# Patient Record
Sex: Male | Born: 1945 | Race: White | Hispanic: No | Marital: Married | Smoking: Former smoker
Health system: Southern US, Community
[De-identification: ages and names within clinical notes are randomized; demographics above are authoritative.]

## PROBLEM LIST (undated history)

## (undated) DIAGNOSIS — M069 Rheumatoid arthritis, unspecified: Secondary | ICD-10-CM

## (undated) DIAGNOSIS — R945 Abnormal results of liver function studies: Secondary | ICD-10-CM

## (undated) DIAGNOSIS — C4490 Unspecified malignant neoplasm of skin, unspecified: Secondary | ICD-10-CM

## (undated) DIAGNOSIS — M8580 Other specified disorders of bone density and structure, unspecified site: Secondary | ICD-10-CM

## (undated) DIAGNOSIS — K219 Gastro-esophageal reflux disease without esophagitis: Secondary | ICD-10-CM

## (undated) DIAGNOSIS — R7989 Other specified abnormal findings of blood chemistry: Secondary | ICD-10-CM

## (undated) DIAGNOSIS — C4491 Basal cell carcinoma of skin, unspecified: Secondary | ICD-10-CM

## (undated) DIAGNOSIS — I10 Essential (primary) hypertension: Secondary | ICD-10-CM

## (undated) DIAGNOSIS — I2510 Atherosclerotic heart disease of native coronary artery without angina pectoris: Secondary | ICD-10-CM

## (undated) HISTORY — PX: CARDIAC CATHETERIZATION: SHX172

## (undated) HISTORY — DX: Unspecified malignant neoplasm of skin, unspecified: C44.90

## (undated) HISTORY — DX: Basal cell carcinoma of skin, unspecified: C44.91

## (undated) HISTORY — DX: Abnormal results of liver function studies: R94.5

## (undated) HISTORY — DX: Other specified abnormal findings of blood chemistry: R79.89

## (undated) HISTORY — DX: Atherosclerotic heart disease of native coronary artery without angina pectoris: I25.10

## (undated) HISTORY — DX: Other specified disorders of bone density and structure, unspecified site: M85.80

## (undated) HISTORY — PX: APPENDECTOMY: SHX54

## (undated) HISTORY — DX: Rheumatoid arthritis, unspecified: M06.9

## (undated) HISTORY — PX: COSMETIC SURGERY: SHX468

---

## 2004-12-01 ENCOUNTER — Encounter: Payer: Self-pay | Admitting: Family Medicine

## 2004-12-01 ENCOUNTER — Ambulatory Visit: Payer: Self-pay | Admitting: Family Medicine

## 2004-12-01 LAB — CONVERTED CEMR LAB: PSA: NORMAL ng/mL

## 2004-12-03 ENCOUNTER — Ambulatory Visit: Payer: Self-pay | Admitting: Family Medicine

## 2004-12-16 ENCOUNTER — Encounter: Admission: RE | Admit: 2004-12-16 | Discharge: 2005-02-01 | Payer: Self-pay | Admitting: Family Medicine

## 2004-12-23 ENCOUNTER — Ambulatory Visit: Payer: Self-pay | Admitting: Family Medicine

## 2004-12-23 LAB — CONVERTED CEMR LAB: Microalbumin U total vol: NORMAL mg/L

## 2005-08-04 ENCOUNTER — Ambulatory Visit: Payer: Self-pay | Admitting: Family Medicine

## 2005-08-14 ENCOUNTER — Ambulatory Visit: Payer: Self-pay | Admitting: Family Medicine

## 2005-09-01 ENCOUNTER — Ambulatory Visit: Payer: Self-pay | Admitting: Family Medicine

## 2005-09-01 LAB — CONVERTED CEMR LAB: Hgb A1c MFr Bld: 6.3 %

## 2005-09-16 ENCOUNTER — Ambulatory Visit: Payer: Self-pay | Admitting: Family Medicine

## 2005-10-14 ENCOUNTER — Ambulatory Visit: Payer: Self-pay | Admitting: Family Medicine

## 2005-10-30 ENCOUNTER — Ambulatory Visit: Payer: Self-pay | Admitting: Family Medicine

## 2005-11-10 DIAGNOSIS — M069 Rheumatoid arthritis, unspecified: Secondary | ICD-10-CM | POA: Insufficient documentation

## 2005-11-10 DIAGNOSIS — M949 Disorder of cartilage, unspecified: Secondary | ICD-10-CM

## 2005-11-10 DIAGNOSIS — K219 Gastro-esophageal reflux disease without esophagitis: Secondary | ICD-10-CM | POA: Insufficient documentation

## 2005-11-10 DIAGNOSIS — J309 Allergic rhinitis, unspecified: Secondary | ICD-10-CM | POA: Insufficient documentation

## 2005-11-10 DIAGNOSIS — N200 Calculus of kidney: Secondary | ICD-10-CM

## 2005-11-10 DIAGNOSIS — M899 Disorder of bone, unspecified: Secondary | ICD-10-CM | POA: Insufficient documentation

## 2005-11-10 DIAGNOSIS — N400 Enlarged prostate without lower urinary tract symptoms: Secondary | ICD-10-CM | POA: Insufficient documentation

## 2005-11-10 DIAGNOSIS — E119 Type 2 diabetes mellitus without complications: Secondary | ICD-10-CM

## 2005-11-10 DIAGNOSIS — E669 Obesity, unspecified: Secondary | ICD-10-CM

## 2005-11-10 DIAGNOSIS — K5730 Diverticulosis of large intestine without perforation or abscess without bleeding: Secondary | ICD-10-CM | POA: Insufficient documentation

## 2005-11-10 DIAGNOSIS — E785 Hyperlipidemia, unspecified: Secondary | ICD-10-CM | POA: Insufficient documentation

## 2005-11-10 DIAGNOSIS — I10 Essential (primary) hypertension: Secondary | ICD-10-CM

## 2005-11-10 DIAGNOSIS — M2550 Pain in unspecified joint: Secondary | ICD-10-CM | POA: Insufficient documentation

## 2006-05-27 ENCOUNTER — Encounter: Payer: Self-pay | Admitting: Family Medicine

## 2006-06-17 ENCOUNTER — Ambulatory Visit: Payer: Self-pay | Admitting: Family Medicine

## 2006-06-17 DIAGNOSIS — L570 Actinic keratosis: Secondary | ICD-10-CM | POA: Insufficient documentation

## 2006-06-17 DIAGNOSIS — I2510 Atherosclerotic heart disease of native coronary artery without angina pectoris: Secondary | ICD-10-CM | POA: Insufficient documentation

## 2006-08-16 ENCOUNTER — Ambulatory Visit: Payer: Self-pay | Admitting: Family Medicine

## 2006-08-16 DIAGNOSIS — D692 Other nonthrombocytopenic purpura: Secondary | ICD-10-CM | POA: Insufficient documentation

## 2006-08-24 ENCOUNTER — Encounter: Payer: Self-pay | Admitting: Family Medicine

## 2006-09-21 ENCOUNTER — Ambulatory Visit: Payer: Self-pay | Admitting: Family Medicine

## 2006-09-21 LAB — CONVERTED CEMR LAB
BUN: 14 mg/dL (ref 6–23)
CO2: 24 meq/L (ref 19–32)
Glucose, Bld: 98 mg/dL (ref 70–99)
LDL Cholesterol: 97 mg/dL (ref 0–99)
Potassium: 4.1 meq/L (ref 3.5–5.3)
Sodium: 137 meq/L (ref 135–145)
Total CHOL/HDL Ratio: 3.6
VLDL: 19 mg/dL (ref 0–40)

## 2006-09-22 ENCOUNTER — Encounter: Payer: Self-pay | Admitting: Family Medicine

## 2006-10-07 ENCOUNTER — Ambulatory Visit: Payer: Self-pay | Admitting: Family Medicine

## 2007-04-18 ENCOUNTER — Encounter: Payer: Self-pay | Admitting: Family Medicine

## 2007-06-07 ENCOUNTER — Encounter: Payer: Self-pay | Admitting: Family Medicine

## 2007-06-07 LAB — CONVERTED CEMR LAB
Albumin: 3.7 g/dL
Alkaline Phosphatase: 34 units/L
BUN: 16 mg/dL
Creatinine, Ser: 0.66 mg/dL
Glucose, Bld: 111 mg/dL
HDL: 40 mg/dL
Hgb A1c MFr Bld: 6.6 %
LDL Cholesterol: 129 mg/dL
Total Bilirubin: 1 mg/dL
Triglycerides: 136 mg/dL
WBC, blood: 5.4 10*3/uL

## 2007-07-25 ENCOUNTER — Telehealth: Payer: Self-pay | Admitting: Family Medicine

## 2007-07-27 ENCOUNTER — Encounter: Payer: Self-pay | Admitting: Family Medicine

## 2007-08-01 ENCOUNTER — Encounter: Payer: Self-pay | Admitting: Family Medicine

## 2007-08-16 ENCOUNTER — Encounter: Payer: Self-pay | Admitting: Family Medicine

## 2007-08-18 ENCOUNTER — Ambulatory Visit: Payer: Self-pay | Admitting: Family Medicine

## 2007-08-18 LAB — CONVERTED CEMR LAB: Hgb A1c MFr Bld: 6.8 %

## 2007-08-22 ENCOUNTER — Encounter: Payer: Self-pay | Admitting: Family Medicine

## 2007-09-26 ENCOUNTER — Encounter: Payer: Self-pay | Admitting: Family Medicine

## 2008-04-18 ENCOUNTER — Encounter: Payer: Self-pay | Admitting: Family Medicine

## 2010-10-07 ENCOUNTER — Encounter: Payer: Self-pay | Admitting: Emergency Medicine

## 2010-10-07 ENCOUNTER — Other Ambulatory Visit: Payer: Self-pay | Admitting: Emergency Medicine

## 2010-10-07 ENCOUNTER — Inpatient Hospital Stay (INDEPENDENT_AMBULATORY_CARE_PROVIDER_SITE_OTHER)
Admission: RE | Admit: 2010-10-07 | Discharge: 2010-10-07 | Disposition: A | Discharge status: Home / Self Care | Payer: BC Managed Care – PPO | Source: Ambulatory Visit | Attending: Emergency Medicine | Admitting: Emergency Medicine

## 2010-10-07 ENCOUNTER — Telehealth (INDEPENDENT_AMBULATORY_CARE_PROVIDER_SITE_OTHER): Payer: Self-pay | Admitting: *Deleted

## 2010-10-07 ENCOUNTER — Ambulatory Visit
Admission: RE | Admit: 2010-10-07 | Discharge: 2010-10-07 | Disposition: A | Discharge status: Home / Self Care | Payer: BC Managed Care – PPO | Source: Ambulatory Visit | Attending: Emergency Medicine | Admitting: Emergency Medicine

## 2010-10-07 DIAGNOSIS — M1711 Unilateral primary osteoarthritis, right knee: Secondary | ICD-10-CM | POA: Insufficient documentation

## 2010-10-07 DIAGNOSIS — M25569 Pain in unspecified knee: Secondary | ICD-10-CM

## 2011-01-05 NOTE — Telephone Encounter (Signed)
  Phone Note Outgoing Call   Call placed by: Clemens Catholic LPN,  October 07, 2010 2:36 PM Summary of Call: appt sch'ed with dr Charlann Boxer for 10/17/10 @ 8:15am, pt to pick up copy of xrays from [facility], notes faxed, and pts wife notified of the appt.  Initial call taken by: Clemens Catholic LPN,  October 07, 2010 2:37 PM

## 2011-01-05 NOTE — Progress Notes (Signed)
Summary: Right Knee Pain rm 2   Vital Signs:  Patient Profile:   66 Years Old Male CC:      RT knee pain x 1wk Height:     68.5 inches (173.99 cm) Weight:      249.50 pounds O2 Sat:      97 % O2 treatment:    Room Air Temp:     97.7 degrees F oral Pulse rate:   87 / minute Resp:     20 per minute BP sitting:   120 / 74  (left arm) Cuff size:   large  Pt. in pain?   yes    Location:   knee    Intensity:   8    Type:       sharp  Vitals Entered By: Clemens Catholic LPN (October 07, 2010 9:33 AM)                   Updated Prior Medication List: ALTACE 10 MG CAPS (RAMIPRIL) Take 1 tablet by mouth once a day FOLIC ACID 1 MG TABS (FOLIC ACID) Take 1 tablet by mouth once a day METFORMIN HCL 500 MG TABS (METFORMIN HCL) one by mouth two times a day METHYLPREDNISOLONE 4 MG TABS (METHYLPREDNISOLONE) Take 1 tablet by mouth once a day OMEPRAZOLE 20 MG CPDR (OMEPRAZOLE) Take 1 tablet by mouth daily ACCU-CHEK SOFTCLIX LANCETS  MISC (LANCETS) use as directed NITROGLYCERIN 0.4 MG  SUBL (NITROGLYCERIN) TAke as directed PLAVIX 75 MG TABS (CLOPIDOGREL BISULFATE) Take one tablet by mouth once a day MIRTAZAPINE 30 MG  TBDP (MIRTAZAPINE) Take 1 tablet by mouth once a day at bedtime for insomnia/depression GLIMEPIRIDE 4 MG TABS (GLIMEPIRIDE)  HUMIRA 40 MG/0.8ML KIT (ADALIMUMAB)  NITROSTAT 0.3 MG SUBL (NITROGLYCERIN)   Current Allergies (reviewed today): ! LIPITOR ! * FLU SHOTS ! * FLORESCEINE STAINHistory of Present Illness Chief Complaint: RT knee pain x 1wk History of Present Illness: R knee pain for a week. Has RA and OA and has always had some knee pain.  He also injured it just prior to retirement.  He was golfing last week and felt pain after hitting the ball on an upslope.  Doesn't know if he acutely twisted it.  Using ice and tramadol which helps.  No popping, locking, or giving way.  REVIEW OF SYSTEMS Constitutional Symptoms      Denies fever, chills, night sweats, weight loss,  weight gain, and fatigue.  Eyes       Denies change in vision, eye pain, eye discharge, glasses, contact lenses, and eye surgery. Ear/Nose/Throat/Mouth       Denies hearing loss/aids, change in hearing, ear pain, ear discharge, dizziness, frequent runny nose, frequent nose bleeds, sinus problems, sore throat, hoarseness, and tooth pain or bleeding.  Respiratory       Denies dry cough, productive cough, wheezing, shortness of breath, asthma, bronchitis, and emphysema/COPD.  Cardiovascular       Denies murmurs, chest pain, and tires easily with exhertion.    Gastrointestinal       Denies stomach pain, nausea/vomiting, diarrhea, constipation, blood in bowel movements, and indigestion. Genitourniary       Denies painful urination, kidney stones, and loss of urinary control. Neurological       Denies paralysis, seizures, and fainting/blackouts. Musculoskeletal       Complains of muscle pain, joint pain, and joint stiffness.      Denies decreased range of motion, redness, swelling, muscle weakness, and gout.  Skin  Denies bruising, unusual mles/lumps or sores, and hair/skin or nail changes.  Psych       Denies mood changes, temper/anger issues, anxiety/stress, speech problems, depression, and sleep problems. Other Comments: pt c/o RT knee pain and swelling x 1wk. he has taken tramadol 50 mg with no relief.   Past History:  Past Medical History: Reviewed history from 06/17/2006 and no changes required. 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 Appendectomy  fibroma removed  rhinoplasty cath with stenting 4-08  Family History: Reviewed history from 05/27/2006 and no changes required. brother- Chrons dz, brain  tumor, brother- heart dz age 70  father- heart dz, died @ 103, high chol, HTN, stroke  mother died at 101  Social History: Reviewed history from 11/10/2005 and no changes required. Retired.  Married to [place].  Spends winter in [place].  Quit smoking age 89.  2 grown children.  Does not eat healthywt, not exercising. Physical Exam General appearance: well developed, well nourished, mild distress, using a walker MSE: oriented to time, place, and person R knee: ROM limited by pain about 45 degrees flexion, 2+ suprapatellar effusion, no ecchymoses, +McMurrays, Patella freely mobile. +TTP medial joint line.  Distal NV status intact. Assessment New Problems: KNEE PAIN (ICD-719.46)   Plan New Medications/Changes: TRAMADOL HCL 50 MG TABS (TRAMADOL HCL) 1 by mouth q6-8 hrs as needed for pain  #30 x 0, 10/07/2010, Hoyt Koch MD  New Orders: New Patient Level III [phone] T-DG Knee Complete 4 Views*R* [73564] Planning Comments:   Xray ordered and read by radiology as "Tricompartment degenerative changes with joint effusion."  I expect that this is a medial meniscus tear secondary to OA.  Will order MRI and send to orthopedics for further evaluation.  Wife sees Dr. Charlann Boxer so that may be a good choice.  Tramadol Rx and encourage ice, rest, elevation.    The patient and/or caregiver has been counseled thoroughly with regard to medications prescribed including dosage, schedule, interactions, rationale for use, and possible side effects and they verbalize understanding.  Diagnoses and expected course of recovery discussed and will return if not improved as expected or if the condition worsens. Patient and/or caregiver verbalized understanding.  Prescriptions: TRAMADOL HCL 50 MG TABS (TRAMADOL HCL) 1 by mouth q6-8 hrs as needed for pain  #30 x 0   Entered and Authorized by:   Hoyt Koch MD   Signed by:   Hoyt Koch MD on 10/07/2010   Method used:   Print then Give to Patient   RxID:    [id number]   Orders Added: 1)  New Patient Level III [95621] 2)  T-DG Knee Complete 4 Views*R* [30865]

## 2011-10-28 ENCOUNTER — Emergency Department (HOSPITAL_BASED_OUTPATIENT_CLINIC_OR_DEPARTMENT_OTHER): Payer: BC Managed Care – PPO

## 2011-10-28 ENCOUNTER — Telehealth: Payer: Self-pay | Admitting: *Deleted

## 2011-10-28 ENCOUNTER — Other Ambulatory Visit: Payer: Self-pay | Admitting: Family Medicine

## 2011-10-28 ENCOUNTER — Ambulatory Visit (INDEPENDENT_AMBULATORY_CARE_PROVIDER_SITE_OTHER): Payer: BC Managed Care – PPO | Admitting: Family Medicine

## 2011-10-28 ENCOUNTER — Encounter (HOSPITAL_BASED_OUTPATIENT_CLINIC_OR_DEPARTMENT_OTHER): Payer: Self-pay

## 2011-10-28 ENCOUNTER — Encounter: Payer: Self-pay | Admitting: Family Medicine

## 2011-10-28 ENCOUNTER — Ambulatory Visit (HOSPITAL_BASED_OUTPATIENT_CLINIC_OR_DEPARTMENT_OTHER)
Admission: RE | Admit: 2011-10-28 | Discharge: 2011-10-28 | Disposition: A | Discharge status: Home / Self Care | Payer: BC Managed Care – PPO | Source: Ambulatory Visit | Attending: Physician Assistant | Admitting: Physician Assistant

## 2011-10-28 ENCOUNTER — Ambulatory Visit (INDEPENDENT_AMBULATORY_CARE_PROVIDER_SITE_OTHER): Payer: BC Managed Care – PPO

## 2011-10-28 ENCOUNTER — Other Ambulatory Visit: Payer: Self-pay

## 2011-10-28 ENCOUNTER — Inpatient Hospital Stay (HOSPITAL_BASED_OUTPATIENT_CLINIC_OR_DEPARTMENT_OTHER)
Admission: EM | Admit: 2011-10-28 | Discharge: 2011-11-01 | DRG: 541 | Disposition: A | Discharge status: Home / Self Care | Payer: BC Managed Care – PPO | Attending: Internal Medicine | Admitting: Internal Medicine

## 2011-10-28 VITALS — BP 151/92 | HR 69 | Temp 97.7°F | Wt 249.0 lb

## 2011-10-28 DIAGNOSIS — L570 Actinic keratosis: Secondary | ICD-10-CM

## 2011-10-28 DIAGNOSIS — I2692 Saddle embolus of pulmonary artery without acute cor pulmonale: Secondary | ICD-10-CM | POA: Diagnosis present

## 2011-10-28 DIAGNOSIS — E785 Hyperlipidemia, unspecified: Secondary | ICD-10-CM | POA: Diagnosis present

## 2011-10-28 DIAGNOSIS — M2550 Pain in unspecified joint: Secondary | ICD-10-CM

## 2011-10-28 DIAGNOSIS — Z6836 Body mass index (BMI) 36.0-36.9, adult: Secondary | ICD-10-CM

## 2011-10-28 DIAGNOSIS — J309 Allergic rhinitis, unspecified: Secondary | ICD-10-CM

## 2011-10-28 DIAGNOSIS — I82409 Acute embolism and thrombosis of unspecified deep veins of unspecified lower extremity: Secondary | ICD-10-CM

## 2011-10-28 DIAGNOSIS — R0602 Shortness of breath: Secondary | ICD-10-CM

## 2011-10-28 DIAGNOSIS — M899 Disorder of bone, unspecified: Secondary | ICD-10-CM | POA: Diagnosis present

## 2011-10-28 DIAGNOSIS — I2510 Atherosclerotic heart disease of native coronary artery without angina pectoris: Secondary | ICD-10-CM | POA: Diagnosis present

## 2011-10-28 DIAGNOSIS — I824Y9 Acute embolism and thrombosis of unspecified deep veins of unspecified proximal lower extremity: Secondary | ICD-10-CM | POA: Insufficient documentation

## 2011-10-28 DIAGNOSIS — E119 Type 2 diabetes mellitus without complications: Secondary | ICD-10-CM | POA: Diagnosis present

## 2011-10-28 DIAGNOSIS — N200 Calculus of kidney: Secondary | ICD-10-CM

## 2011-10-28 DIAGNOSIS — R05 Cough: Secondary | ICD-10-CM

## 2011-10-28 DIAGNOSIS — Z9861 Coronary angioplasty status: Secondary | ICD-10-CM

## 2011-10-28 DIAGNOSIS — M25569 Pain in unspecified knee: Secondary | ICD-10-CM

## 2011-10-28 DIAGNOSIS — K5730 Diverticulosis of large intestine without perforation or abscess without bleeding: Secondary | ICD-10-CM

## 2011-10-28 DIAGNOSIS — I82419 Acute embolism and thrombosis of unspecified femoral vein: Secondary | ICD-10-CM | POA: Diagnosis present

## 2011-10-28 DIAGNOSIS — R600 Localized edema: Secondary | ICD-10-CM

## 2011-10-28 DIAGNOSIS — K219 Gastro-esophageal reflux disease without esophagitis: Secondary | ICD-10-CM | POA: Diagnosis present

## 2011-10-28 DIAGNOSIS — D692 Other nonthrombocytopenic purpura: Secondary | ICD-10-CM

## 2011-10-28 DIAGNOSIS — Z23 Encounter for immunization: Secondary | ICD-10-CM

## 2011-10-28 DIAGNOSIS — R059 Cough, unspecified: Secondary | ICD-10-CM

## 2011-10-28 DIAGNOSIS — I10 Essential (primary) hypertension: Secondary | ICD-10-CM | POA: Diagnosis present

## 2011-10-28 DIAGNOSIS — E669 Obesity, unspecified: Secondary | ICD-10-CM | POA: Diagnosis present

## 2011-10-28 DIAGNOSIS — M949 Disorder of cartilage, unspecified: Secondary | ICD-10-CM | POA: Diagnosis present

## 2011-10-28 DIAGNOSIS — R609 Edema, unspecified: Secondary | ICD-10-CM

## 2011-10-28 DIAGNOSIS — M069 Rheumatoid arthritis, unspecified: Secondary | ICD-10-CM | POA: Diagnosis present

## 2011-10-28 DIAGNOSIS — R7989 Other specified abnormal findings of blood chemistry: Secondary | ICD-10-CM

## 2011-10-28 DIAGNOSIS — I2699 Other pulmonary embolism without acute cor pulmonale: Principal | ICD-10-CM | POA: Diagnosis present

## 2011-10-28 HISTORY — DX: Essential (primary) hypertension: I10

## 2011-10-28 HISTORY — DX: Gastro-esophageal reflux disease without esophagitis: K21.9

## 2011-10-28 LAB — CBC WITH DIFFERENTIAL/PLATELET
Basophils Absolute: 0 10*3/uL (ref 0.0–0.1)
Basophils Absolute: 0 10*3/uL (ref 0.0–0.1)
Basophils Relative: 0 % (ref 0–1)
Basophils Relative: 0 % (ref 0–1)
Eosinophils Absolute: 0.3 10*3/uL (ref 0.0–0.7)
Eosinophils Relative: 3 % (ref 0–5)
HCT: 38 % — ABNORMAL LOW (ref 39.0–52.0)
Hemoglobin: 13.6 g/dL (ref 13.0–17.0)
Hemoglobin: 13.2 g/dL (ref 13.0–17.0)
MCHC: 34.1 g/dL (ref 30.0–36.0)
MCHC: 34.7 g/dL (ref 30.0–36.0)
MCV: 84.4 fL (ref 78.0–100.0)
Monocytes Absolute: 0.6 10*3/uL (ref 0.1–1.0)
Monocytes Relative: 9 % (ref 3–12)
Monocytes Relative: 8 % (ref 3–12)
Neutro Abs: 4.3 10*3/uL (ref 1.7–7.7)
Neutrophils Relative %: 60 % (ref 43–77)
Platelets: 191 10*3/uL (ref 150–400)
RDW: 12.9 % (ref 11.5–15.5)

## 2011-10-28 LAB — COMPLETE METABOLIC PANEL WITH GFR
ALT: 25 U/L (ref 0–53)
AST: 22 U/L (ref 0–37)
Albumin: 4.2 g/dL (ref 3.5–5.2)
Alkaline Phosphatase: 32 U/L — ABNORMAL LOW (ref 39–117)
Potassium: 4.1 mEq/L (ref 3.5–5.3)
Sodium: 135 mEq/L (ref 135–145)
Total Bilirubin: 0.5 mg/dL (ref 0.3–1.2)
Total Protein: 7.4 g/dL (ref 6.0–8.3)

## 2011-10-28 LAB — TSH: TSH: 2.125 u[IU]/mL (ref 0.350–4.500)

## 2011-10-28 LAB — BASIC METABOLIC PANEL
BUN: 10 mg/dL (ref 6–23)
CO2: 25 mEq/L (ref 19–32)
Calcium: 10 mg/dL (ref 8.4–10.5)
Chloride: 98 mEq/L (ref 96–112)
Creatinine, Ser: 0.6 mg/dL (ref 0.50–1.35)
GFR calc Af Amer: >90 mL/min (ref >90)

## 2011-10-28 LAB — D-DIMER, QUANTITATIVE: D-Dimer, Quant: 5.9 ug/mL-FEU — ABNORMAL HIGH (ref 0.00–0.48)

## 2011-10-28 LAB — PROTIME-INR: Prothrombin Time: 12.8 seconds (ref 11.6–15.2)

## 2011-10-28 MED ORDER — IOHEXOL 350 MG/ML SOLN
80.0000 mL | Freq: Once | INTRAVENOUS | Status: AC | PRN
  Administered 2011-10-28: 80 mL via INTRAVENOUS

## 2011-10-28 MED ORDER — DOXYCYCLINE HYCLATE 100 MG PO TABS: 100.0000 mg | ORAL_TABLET | Freq: Two times a day (BID) | ORAL | Status: DC

## 2011-10-28 MED ORDER — ENOXAPARIN SODIUM 120 MG/0.8ML ~~LOC~~ SOLN
110.0000 mg | Freq: Two times a day (BID) | SUBCUTANEOUS | Status: DC
  Administered 2011-10-29: 110 mg via SUBCUTANEOUS
  Administered 2011-10-29: 05:00:00 via SUBCUTANEOUS
  Administered 2011-10-30 – 2011-11-01 (×5): 110 mg via SUBCUTANEOUS
  Filled 2011-10-28 (×9): qty 0.8

## 2011-10-28 MED ORDER — ONDANSETRON HCL 4 MG/2ML IJ SOLN: 4.0000 mg | Freq: Three times a day (TID) | INTRAMUSCULAR | Status: DC | PRN

## 2011-10-28 MED ORDER — ENOXAPARIN SODIUM 40 MG/0.4ML ~~LOC~~ SOLN
40.0000 mg | SUBCUTANEOUS | Status: DC
  Filled 2011-10-28: qty 0.4

## 2011-10-28 MED ORDER — ASPIRIN 81 MG PO TABS
81.0000 mg | ORAL_TABLET | Freq: Every day | ORAL | Status: DC
  Administered 2011-10-29 – 2011-10-30 (×2): 81 mg via ORAL
  Filled 2011-10-28 (×2): qty 1

## 2011-10-28 MED ORDER — ONDANSETRON HCL 4 MG PO TABS: 4.0000 mg | ORAL_TABLET | Freq: Four times a day (QID) | ORAL | Status: DC | PRN

## 2011-10-28 MED ORDER — METHYLPREDNISOLONE 4 MG PO TABS
4.0000 mg | ORAL_TABLET | Freq: Every day | ORAL | Status: DC
  Administered 2011-10-29 – 2011-10-30 (×2): 4 mg via ORAL
  Filled 2011-10-28 (×3): qty 1

## 2011-10-28 MED ORDER — ENOXAPARIN SODIUM 120 MG/0.8ML ~~LOC~~ SOLN
1.0000 mg/kg | Freq: Once | SUBCUTANEOUS | Status: AC
Start: 2011-10-28 — End: 2011-10-28
  Administered 2011-10-28: 115 mg via SUBCUTANEOUS
  Filled 2011-10-28: qty 0.8

## 2011-10-28 MED ORDER — WARFARIN SODIUM 10 MG PO TABS
10.0000 mg | ORAL_TABLET | ORAL | Status: AC
  Administered 2011-10-29: 10 mg via ORAL
  Filled 2011-10-28: qty 1

## 2011-10-28 MED ORDER — GLIMEPIRIDE 4 MG PO TABS
4.0000 mg | ORAL_TABLET | Freq: Every day | ORAL | Status: DC
  Administered 2011-10-29 – 2011-11-01 (×4): 4 mg via ORAL
  Filled 2011-10-28 (×5): qty 1

## 2011-10-28 MED ORDER — RAMIPRIL 10 MG PO TABS
10.0000 mg | ORAL_TABLET | Freq: Every day | ORAL | Status: DC
  Administered 2011-10-29 – 2011-11-01 (×4): 10 mg via ORAL
  Filled 2011-10-28 (×4): qty 1

## 2011-10-28 MED ORDER — ONDANSETRON HCL 4 MG/2ML IJ SOLN: 4.0000 mg | Freq: Four times a day (QID) | INTRAMUSCULAR | Status: DC | PRN

## 2011-10-28 MED ORDER — NITROGLYCERIN 0.4 MG SL SUBL: 0.4000 mg | SUBLINGUAL_TABLET | SUBLINGUAL | Status: DC | PRN

## 2011-10-28 MED ORDER — METFORMIN HCL 500 MG PO TABS
500.0000 mg | ORAL_TABLET | Freq: Two times a day (BID) | ORAL | Status: DC
  Administered 2011-10-29: 500 mg via ORAL
  Filled 2011-10-28 (×3): qty 1

## 2011-10-28 MED ORDER — WARFARIN - PHARMACIST DOSING INPATIENT: Freq: Every day | Status: DC

## 2011-10-28 MED ORDER — MIRTAZAPINE 30 MG PO TABS
30.0000 mg | ORAL_TABLET | Freq: Every day | ORAL | Status: DC
  Administered 2011-10-30: 30 mg via ORAL
  Filled 2011-10-28 (×5): qty 1

## 2011-10-28 MED ORDER — MORPHINE SULFATE 2 MG/ML IJ SOLN: 1.0000 mg | INTRAMUSCULAR | Status: DC | PRN

## 2011-10-28 MED ORDER — CLOPIDOGREL BISULFATE 75 MG PO TABS
75.0000 mg | ORAL_TABLET | Freq: Every day | ORAL | Status: DC
  Administered 2011-10-29 – 2011-10-30 (×2): 75 mg via ORAL
  Filled 2011-10-28 (×4): qty 1

## 2011-10-28 MED ORDER — HYDROCODONE-ACETAMINOPHEN 5-325 MG PO TABS
1.0000 | ORAL_TABLET | ORAL | Status: DC | PRN
  Administered 2011-10-31 – 2011-11-01 (×4): 1 via ORAL
  Filled 2011-10-28 (×2): qty 1
  Filled 2011-10-28: qty 2
  Filled 2011-10-28: qty 1

## 2011-10-28 MED ORDER — SODIUM CHLORIDE 0.9 % IJ SOLN: 3.0000 mL | INTRAMUSCULAR | Status: DC | PRN

## 2011-10-28 MED ORDER — PANTOPRAZOLE SODIUM 40 MG PO TBEC
40.0000 mg | DELAYED_RELEASE_TABLET | Freq: Every day | ORAL | Status: DC
  Administered 2011-10-29 – 2011-10-31 (×3): 40 mg via ORAL
  Filled 2011-10-28 (×3): qty 1

## 2011-10-28 MED ORDER — SODIUM CHLORIDE 0.9 % IJ SOLN
3.0000 mL | Freq: Two times a day (BID) | INTRAMUSCULAR | Status: DC
  Administered 2011-10-28 – 2011-11-01 (×8): 3 mL via INTRAVENOUS

## 2011-10-28 NOTE — H&P (Addendum)
[facility] History and Physical  Francisco Moore NGE:[id number] DOB: 02-Apr-1945 DOA: 10/28/2011  Referring physician: ED physician PCP: Seymour Bars, DO   Chief Complaint: Shortness of breath  HPI:  Pt is 66 yo male who presented to [facility] ED with main concern of progressively worsening right lower extremity pain, intermittent, radiating to right foot area, aggravated by walking and no specific alleviating symptoms. He also reports shortness of breath but endorses that is chronic for him and unchanged from baseline. Pt denies chest pain and no abdominal or urinary concerns. In ED, right lower extremity with DVT and pulmonary emboli noted on CT chest were confirmed and pt was transferred to [facility].  Assessment and Plan:  Principal Problem:  *Pulmonary emboli - Lovenox was already given in ED - will ask pharmacy to help with dosing - plan transition to Coumadin - continue to provide supportive care  Active Problems:  DVT of deep femoral vein - noted in right lower extremity on imagining studies - continue same med regimen as noted above   DIABETES MELLITUS II, UNCOMPLICATED - will check A1C and for now will continue home medication regimen   HYPERLIPIDEMIA - will check lipid panel - continue statin   RHEUMATOID ARTHRITIS (NOT JUVENILE) - continue methylprednisone  Code Status: Full Family Communication: Pt at bedside Disposition Plan: Admitted to SDU   Review of Systems:  Constitutional: Negative for fever, chills and malaise/fatigue. Negative for diaphoresis.  HENT: Negative for hearing loss, ear pain, nosebleeds, congestion, sore throat, neck pain, tinnitus and ear discharge.   Eyes: Negative for blurred vision, double vision, photophobia, pain, discharge and redness.  Respiratory: Negative for cough, hemoptysis, sputum production, positive for shortness of breath, negative for wheezing and stridor.   Cardiovascular: Negative for chest pain, palpitations,  orthopnea, claudication and leg swelling.  Gastrointestinal: Negative for nausea, vomiting and abdominal pain. Negative for heartburn, constipation, blood in stool and melena.  Genitourinary: Negative for dysuria, urgency, frequency, hematuria and flank pain.  Musculoskeletal: Negative for myalgias, back pain, joint pain and falls. Positive for right lower extremity pain. Skin: Negative for itching and rash.  Neurological: Negative for dizziness and weakness. Negative for tingling, tremors, sensory change, speech change, focal weakness, loss of consciousness and headaches.  Endo/Heme/Allergies: Negative for environmental allergies and polydipsia. Does not bruise/bleed easily.  Psychiatric/Behavioral: Negative for suicidal ideas. The patient is not nervous/anxious.      Past Medical History  Diagnosis Date  . CAD (coronary artery disease)     [place] [facility]- Dr. Philis Kendall & Dr. Arnoldo Lenis   . Rheumatoid arthritis     Dr. Jimmy Footman   . 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 Skin cancer   . Elevated LFTs   . Diabetes mellitus   . Acid reflux   . Hypertension     Past Surgical History  Procedure Date  . Appendectomy   . Cosmetic surgery     rhinoplasty   . Cardiac catheterization   . Coronary stent placement     Social History:  reports that he has quit smoking. He has never used smokeless tobacco. He reports that he drinks alcohol. He reports that he does not use illicit drugs.  Allergies  Allergen Reactions  . Atorvastatin Other (See Comments)    REACTION: elevated transaminases  . Flurosyn (Fluocinolone) Other (See Comments)    Made eyes burn.    Family History  Problem Relation Age of Onset  . Heart disease Mother  heart attack  . Stroke Mother   . Heart disease Father   . Hypertension Father   . Hyperlipidemia Father   . Stroke Father   . Crohn's disease Brother   . Heart disease Brother     Prior to Admission medications   Medication Sig Start Date End Date  Taking? Authorizing Provider  adalimumab (HUMIRA) 40 MG/0.8ML injection Inject 40 mg into the skin every 7 (seven) days.   Yes Historical Provider, MD  aspirin 81 MG tablet Take 81 mg by mouth daily.   Yes Historical Provider, MD  Calcium Carbonate-Vitamin D (CALCIUM 600 + D PO) Take by mouth.   Yes Historical Provider, MD  clopidogrel (PLAVIX) 75 MG tablet Take 75 mg by mouth daily.   Yes Historical Provider, MD  fish oil-omega-3 fatty acids 1000 MG capsule Take 2 g by mouth daily.   Yes Historical Provider, MD  glimepiride (AMARYL) 4 MG tablet Take 4 mg by mouth daily before breakfast.   Yes Historical Provider, MD  metFORMIN (GLUCOPHAGE) 500 MG tablet Take 500 mg by mouth 2 (two) times daily with a meal.   Yes Historical Provider, MD  methylPREDNISolone (MEDROL) 4 MG tablet Take 4 mg by mouth daily.   Yes Historical Provider, MD  Multiple Vitamin (MULTIVITAMIN) capsule Take 1 capsule by mouth daily.   Yes Historical Provider, MD  nitroGLYCERIN (NITROSTAT) 0.4 MG SL tablet Place 0.4 mg under the tongue every 5 (five) minutes as needed.   Yes Historical Provider, MD  omeprazole (PRILOSEC) 20 MG capsule Take 20 mg by mouth daily.   Yes Historical Provider, MD  ramipril (ALTACE) 10 MG tablet Take 10 mg by mouth daily.   Yes Historical Provider, MD  doxycycline (VIBRA-TABS) 100 MG tablet Take 1 tablet (100 mg total) by mouth 2 (two) times daily. 10/28/11 11/07/11  Agapito Games, MD  mirtazapine (REMERON) 30 MG tablet Take 30 mg by mouth at bedtime.    Historical Provider, MD    Physical Exam: Filed Vitals:   10/28/11 2132 10/28/11 2139 10/28/11 2235 10/28/11 2236  BP: 136/68 139/75 136/85 136/85  Pulse: 63 70  65  Temp: 97.5 F (36.4 C)     TempSrc:    Oral  Resp: 16   16  Height:      Weight:      SpO2: 95% 96%  97%    Physical Exam  Constitutional: Appears well-developed and well-nourished. No distress.  HENT: Normocephalic. External right and left ear normal. Oropharynx is  clear and moist.  Eyes: Conjunctivae and EOM are normal. PERRLA, no scleral icterus.  Neck: Normal ROM. Neck supple. No JVD. No tracheal deviation. No thyromegaly.  CVS: RRR, S1/S2 +, no murmurs, no gallops, no carotid bruit.  Pulmonary: Effort and breath sounds normal, no stridor, rhonchi, wheezes, rales.  Abdominal: Soft. BS +,  no distension, tenderness, rebound or guarding.  Musculoskeletal: Normal range of motion. Right lower extremity swelling and tenderness to palpation Lymphadenopathy: No lymphadenopathy noted, cervical, inguinal. Neuro: Alert. Normal reflexes, muscle tone coordination. No cranial nerve deficit. Skin: Skin is warm and dry. No rash noted. Not diaphoretic. No erythema. No pallor.  Psychiatric: Normal mood and affect. Behavior, judgment, thought content normal.   Labs on Admission:  Basic Metabolic Panel:  Lab 10/28/11 1610 10/28/11 1020  NA 135 135  K 4.0 4.1  CL 98 100  CO2 25 25  GLUCOSE 98 155*  BUN 10 11  CREATININE 0.60 0.66  CALCIUM 10.0 9.5  MG -- --  PHOS -- --   Liver Function Tests:  Lab 10/28/11 1020  AST 22  ALT 25  ALKPHOS 32*  BILITOT 0.5  PROT 7.4  ALBUMIN 4.2   CBC:  Lab 10/28/11 1925 10/28/11 1020  WBC 7.8 7.2  NEUTROABS 5.3 4.3  HGB 13.2 13.6  HCT 38.0* 39.9  MCV 84.4 84.9  PLT 163 191    Radiological Exams on Admission:  Dg Chest 2 View 10/28/2011  IMPRESSION:   1. Airway thickening may reflect bronchitis or reactive airways disease.  No airspace opacity is identified to suggest bacterial pneumonia pattern.  2.  Thoracic spondylosis.   Ct Angio Chest W/cm &/or Wo Cm 10/28/2011   IMPRESSION:  Bilateral pulmonary emboli, as described above, predominantly centrally within the right lower lobe pulmonary artery.  Overall clot burden is moderate to large.  7 mm left lower lobe nodule.  If this patient is high risk for primary bronchogenic carcinoma, initial follow-up CT chest is suggested in 3-6 months.  Otherwise, follow-up  CT is suggested in 6- 12 months.  This recommendation follows the consensus statement: Guidelines for Management of Small Pulmonary Nodules Detected on CT Scans:   US Venous Img Lower Unilateral Right 10/28/2011    IMPRESSION:  Positive for deep venous thrombosis from the level of the right common femoral vein/saphenous femoral vein junction to the right calf.     EKG: Normal sinus rhythm, no ST/T wave changes  Debbora Presto, MD  [facility] Pager [phone]  If 7PM-7AM, please contact night-coverage [email or web address] Password [facility] 10/28/2011, 10:46 PM

## 2011-10-28 NOTE — ED Notes (Signed)
Pt referred to [facility] for Ultrasound for possible DVT, by PCP.  Ultrasound came back positive for DVT in R leg.  Pt sts SOB for several weeks.

## 2011-10-28 NOTE — Progress Notes (Signed)
ANTICOAGULATION CONSULT NOTE - Initial Consult  Pharmacy Consult for Lovenox/Coumadin Indication: RLE DVT with B/L PE  Allergies  Allergen Reactions  . Atorvastatin Other (See Comments)    REACTION: elevated transaminases  . Flurosyn (Fluocinolone) Other (See Comments)    Made eyes burn.    Patient Measurements: Height: 5\' 8"  (172.7 cm) Weight: 244 lb 4.3 oz (110.8 kg) IBW/kg (Calculated) : 68.4   Vital Signs: Temp: 98.1 F (36.7 C) (09/25 2304) Temp src: Oral (09/25 2304) BP: 136/85 mmHg (09/25 2304) Pulse Rate: 66  (09/25 2304)  Labs:  Basename 10/28/11 1925 10/28/11 1020  HGB 13.2 13.6  HCT 38.0* 39.9  PLT 163 191  APTT -- --  LABPROT 12.8 --  INR 0.97 --  HEPARINUNFRC -- --  CREATININE 0.60 0.66  CKTOTAL -- --  CKMB -- --  TROPONINI -- --    Estimated Creatinine Clearance: 112.7 ml/min (by C-G formula based on Cr of 0.6).   Medical History: Past Medical History  Diagnosis Date  . CAD (coronary artery disease)     [place] [facility]- Dr. Philis Kendall & Dr. Arnoldo Lenis   . Rheumatoid arthritis     Dr. Jimmy Footman   . 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 Skin cancer   . Elevated LFTs   . Diabetes mellitus   . Acid reflux   . Hypertension     Medications:  Prescriptions prior to admission  Medication Sig Dispense Refill  . adalimumab (HUMIRA) 40 MG/0.8ML injection Inject 40 mg into the skin every 7 (seven) days.      Marland Kitchen aspirin 81 MG tablet Take 81 mg by mouth daily.      . Calcium Carbonate-Vitamin D (CALCIUM 600 + D PO) Take by mouth.      . clopidogrel (PLAVIX) 75 MG tablet Take 75 mg by mouth daily.      . fish oil-omega-3 fatty acids 1000 MG capsule Take 2 g by mouth daily.      Marland Kitchen glimepiride (AMARYL) 4 MG tablet Take 4 mg by mouth daily before breakfast.      . metFORMIN (GLUCOPHAGE) 500 MG tablet Take 500 mg by mouth 2 (two) times daily with a meal.      . methylPREDNISolone (MEDROL) 4 MG tablet Take 4 mg by mouth daily.      . Multiple Vitamin  (MULTIVITAMIN) capsule Take 1 capsule by mouth daily.      . nitroGLYCERIN (NITROSTAT) 0.4 MG SL tablet Place 0.4 mg under the tongue every 5 (five) minutes as needed.      Marland Kitchen omeprazole (PRILOSEC) 20 MG capsule Take 20 mg by mouth daily.      . ramipril (ALTACE) 10 MG tablet Take 10 mg by mouth daily.      Marland Kitchen doxycycline (VIBRA-TABS) 100 MG tablet Take 1 tablet (100 mg total) by mouth 2 (two) times daily.  20 tablet  0  . mirtazapine (REMERON) 30 MG tablet Take 30 mg by mouth at bedtime.        Assessment: 66 y/o male patient admitted with sob and leg pain, found to have acute DVT with B/L PE requiring anticoagulation. Received Lovenox 115mg  at 1850 at [facility]. Now to continue lovenox therapy and to add coumadin for treatment.  Goal of Therapy:  INR 2-3 Monitor platelets by anticoagulation protocol: Yes   Plan:  Lovenox 110mg  sq q12h, coumadin 10mg  today and f/u daily protime.   Verlene Mayer, PharmD, BCPS Pager [phone] 10/28/2011,11:35 PM

## 2011-10-28 NOTE — Progress Notes (Signed)
  Subjective:    Patient ID: Francisco Moore, male    DOB: 1945/09/13, 66 y.o.   MRN: [id number]  HPI Right lower extemity pain for 1 wek.  Noticed it was red and swollen yesterday after road his motorcycyle.  Had cough x 3 weeks. Thought was allergies and started benadryl, helped some.  Sinus congestion is better.  If he gets hot he starts coughing.  No fever or chills.  Right leg foot hot to touch, looks swollen. Getting progressively worse.  Has been mor SOB as well. Has a lot o fmucou in his head.   Note, his main primary care provider is in [place]. He says most of this year there but does come back to [place] for the summers. In fact he is actually leaving on Friday to go back to [place] for the winter. Review of Systems     Objective:   Physical Exam  Constitutional: He is oriented to person, place, and time. He appears well-developed and well-nourished.  HENT:  Head: Normocephalic and atraumatic.  Right Ear: External ear normal.  Left Ear: External ear normal.  Nose: Nose normal.  Mouth/Throat: Oropharynx is clear and moist.  Eyes: Conjunctivae normal and EOM are normal. Pupils are equal, round, and reactive to light.  Neck: Neck supple. No thyromegaly present.  Cardiovascular: Normal rate, regular rhythm and normal heart sounds.        No bruits/.   Pulmonary/Chest: Effort normal and breath sounds normal.  Lymphadenopathy:    He has no cervical adenopathy.  Neurological: He is alert and oriented to person, place, and time.  Skin: Skin is warm and dry.       DP pulse is 2+ on the right foot.  Right LE with 1+ swelling, increased warmth and feels tightness.  Unable to palpate the Post tib pulse.    Psychiatric: He has a normal mood and affect.          Assessment & Plan:  Right lower extremity edema.- I. most suspicious of a blood clot at this point. Would like to get a stat d-dimer. I would also like to check a CBC and a CMP. I see no evidence of cellulitis.    Cough/shortness of breath-I'm also concerned because of the cough and shortness of breath with like to get a chest x-ray.

## 2011-10-28 NOTE — ED Provider Notes (Signed)
History     CSN: [id number]  Arrival date & time 10/28/11  1801   First MD Initiated Contact with Patient 10/28/11 1828      Chief Complaint  Patient presents with  . DVT    (Consider location/radiation/quality/duration/timing/severity/associated sxs/prior treatment) The history is provided by the patient.   66 year old male has noted pain and redness and swelling in his right lower leg for the last 2 weeks. Pain is mild at rest Rates it at 1/10. It is moderate with walking and he rates it at 4/10. He has chronic dyspnea, but he has noted some worsening of his dyspnea and he had one day where he was significantly more dyspneic than normal. He has not had any chest pain, heaviness, tightness, pressure. He denies fever, chills, sweats. He saw his PCP who sent him for a venous Doppler which was positive for DVT. Was then referred to the emergency department.  Past Medical History  Diagnosis Date  . CAD (coronary artery disease)     [place] [facility]- Dr. Philis Kendall & Dr. Arnoldo Lenis   . Rheumatoid arthritis     Dr. Jimmy Footman   . 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 Skin cancer   . Elevated LFTs   . Diabetes mellitus   . Acid reflux   . Hypertension     Past Surgical History  Procedure Date  . Appendectomy   . Cosmetic surgery     rhinoplasty   . Cardiac catheterization   . Coronary stent placement     Family History  Problem Relation Age of Onset  . Heart disease Mother     heart attack  . Stroke Mother   . Heart disease Father   . Hypertension Father   . Hyperlipidemia Father   . Stroke Father   . Crohn's disease Brother   . Heart disease Brother     History  Substance Use Topics  . Smoking status: Former Games developer  . Smokeless tobacco: Never Used   Comment: quit at age 68  . Alcohol Use: Yes     occasionally      Review of Systems  All other systems reviewed and are negative.    Allergies  Atorvastatin and Flurosyn  Home Medications   Current  Outpatient Rx  Name Route Sig Dispense Refill  . ADALIMUMAB 40 MG/0.8ML Echo KIT Subcutaneous Inject 40 mg into the skin every 7 (seven) days.    . ASPIRIN 81 MG PO TABS Oral Take 81 mg by mouth daily.    Marland Kitchen CALCIUM 600 + D PO Oral Take by mouth.    . CLOPIDOGREL BISULFATE 75 MG PO TABS Oral Take 75 mg by mouth daily.    . OMEGA-3 FATTY ACIDS 1000 MG PO CAPS Oral Take 2 g by mouth daily.    Marland Kitchen GLIMEPIRIDE 4 MG PO TABS Oral Take 4 mg by mouth daily before breakfast.    . METFORMIN HCL 500 MG PO TABS Oral Take 500 mg by mouth 2 (two) times daily with a meal.    . METHYLPREDNISOLONE 4 MG PO TABS Oral Take 4 mg by mouth daily.    . MULTIVITAMINS PO CAPS Oral Take 1 capsule by mouth daily.    Marland Kitchen NITROGLYCERIN 0.4 MG SL SUBL Sublingual Place 0.4 mg under the tongue every 5 (five) minutes as needed.    Marland Kitchen OMEPRAZOLE 20 MG PO CPDR Oral Take 20 mg by mouth daily.    Marland Kitchen RAMIPRIL 10 MG PO TABS Oral Take  10 mg by mouth daily.    Marland Kitchen DOXYCYCLINE HYCLATE 100 MG PO TABS Oral Take 1 tablet (100 mg total) by mouth 2 (two) times daily. 20 tablet 0  . MIRTAZAPINE 30 MG PO TABS Oral Take 30 mg by mouth at bedtime.      BP 136/68  Pulse 63  Temp 97.5 F (36.4 C) (Oral)  Ht 5\' 8"  (1.727 m)  Wt 249 lb (112.946 kg)  BMI 37.86 kg/m2  SpO2 95%  Physical Exam  Nursing note and vitals reviewed. 66 year old male, resting comfortably and in no acute distress. Vital signs are normal. Oxygen saturation is 95%, which is normal. Head is normocephalic and atraumatic. PERRLA, EOMI. Oropharynx is clear. Neck is nontender and supple without adenopathy or JVD. Back is nontender and there is no CVA tenderness. Lungs are clear without rales, wheezes, or rhonchi. Chest is nontender. Heart has regular rate and rhythm without murmur. Abdomen is soft, flat, nontender without masses or hepatosplenomegaly and peristalsis is normoactive. Extremities: There is swelling and erythema of the right calf. There is mild tenderness to  palpation. Right calf circumference is 3 cm greater than left calf circumference. There is negative Homans sign. No cords are palpable. There is no tenderness in the thigh or over the inguinal vessels. Distal neurovascular exam is intact with prompt capillary refill, strong pulses, normal sensation.. Skin is warm and dry without rash. Neurologic: Mental status is normal, cranial nerves are intact, there are no motor or sensory deficits.   ED Course  Procedures (including critical care time)  Results for orders placed during the hospital encounter of 10/28/11  CBC WITH DIFFERENTIAL      Component Value Range   WBC 7.8  4.0 - 10.5 K/uL   RBC 4.50  4.22 - 5.81 MIL/uL   Hemoglobin 13.2  13.0 - 17.0 g/dL   HCT 84.1 (*) 32.4 - 40.1 %   MCV 84.4  78.0 - 100.0 fL   MCH 29.3  26.0 - 34.0 pg   MCHC 34.7  30.0 - 36.0 g/dL   RDW 02.7  25.3 - 66.4 %   Platelets 163  150 - 400 K/uL   Neutrophils Relative 69  43 - 77 %   Neutro Abs 5.3  1.7 - 7.7 K/uL   Lymphocytes Relative 20  12 - 46 %   Lymphs Abs 1.6  0.7 - 4.0 K/uL   Monocytes Relative 8  3 - 12 %   Monocytes Absolute 0.6  0.1 - 1.0 K/uL   Eosinophils Relative 3  0 - 5 %   Eosinophils Absolute 0.2  0.0 - 0.7 K/uL   Basophils Relative 0  0 - 1 %   Basophils Absolute 0.0  0.0 - 0.1 K/uL  BASIC METABOLIC PANEL      Component Value Range   Sodium 135  135 - 145 mEq/L   Potassium 4.0  3.5 - 5.1 mEq/L   Chloride 98  96 - 112 mEq/L   CO2 25  19 - 32 mEq/L   Glucose, Bld 98  70 - 99 mg/dL   BUN 10  6 - 23 mg/dL   Creatinine, Ser 4.03  0.50 - 1.35 mg/dL   Calcium 47.4  8.4 - 25.9 mg/dL   GFR calc non Af Amer >90  >90 mL/min   GFR calc Af Amer >90  >90 mL/min  PROTIME-INR      Component Value Range   Prothrombin Time 12.8  11.6 - 15.2 seconds  INR 0.97  0.00 - 1.49   Dg Chest 2 View  10/28/2011  *RADIOLOGY REPORT*  Clinical Data: Cough.  Shortness of breath.  CHEST - 2 VIEW  Comparison: None.  Findings: Thoracic spondylosis is observed.   Airway thickening may reflect bronchitis or reactive airways disease.  No airspace opacity is identified to suggest bacterial pneumonia pattern.  Cardiac and mediastinal contours appear unremarkable.  IMPRESSION:  1. Airway thickening may reflect bronchitis or reactive airways disease.  No airspace opacity is identified to suggest bacterial pneumonia pattern. 2.  Thoracic spondylosis.   Original Report Authenticated By: Dellia Cloud, M.D.    Ct Angio Chest W/cm &/or Wo Cm  10/28/2011  *RADIOLOGY REPORT*  Clinical Data: Shortness of breath, elevated D-dimer  CT ANGIOGRAPHY CHEST  Technique:  Multidetector CT imaging of the chest using the standard protocol during bolus administration of intravenous contrast. Multiplanar reconstructed images including MIPs were obtained and reviewed to evaluate the vascular anatomy.  Contrast: 80mL OMNIPAQUE IOHEXOL 350 MG/ML SOLN  Comparison: Chest radiographs dated 10/28/2011  Findings: Pulmonary embolism at the bifurcation of the right and left pulmonary arteries (saddle embolus).  Small amount of thrombus extends into the left upper lobe pulmonary artery (series 5/image 107). Moderate thrombus at the origin of the right main pulmonary artery and extending primarily into multiple branches of the right lower lobe pulmonary artery (series 5/image 133).  Overall clot burden is moderate to large.  Minimal subpleural reticulation/dependent atelectasis in the bilateral lower lobes.  7 mm nodule in the medial left lower lobe (series 6/image 66). No pleural effusion or pneumothorax.  Visualized thyroid is unremarkable.  The heart is normal in size.  No pericardial effusion.  Coronary atherosclerosis.  Atherosclerotic calcifications of the aortic arch.  No suspicious mediastinal, hilar, or axillary lymphadenopathy.  Visualized upper abdomen is unremarkable.  Degenerative changes of the visualized thoracolumbar spine.  IMPRESSION: Bilateral pulmonary emboli, as described above,  predominantly centrally within the right lower lobe pulmonary artery.  Overall clot burden is moderate to large.  7 mm left lower lobe nodule.  If this patient is high risk for primary bronchogenic carcinoma, initial follow-up CT chest is suggested in 3-6 months.  Otherwise, follow-up CT is suggested in 6- 12 months.  This recommendation follows the consensus statement: Guidelines for Management of Small Pulmonary Nodules Detected on CT Scans:  A Statement from the Fleischner Society as published in Radiology 2005; 237:395-400.  Critical Value/emergent results were called by telephone at the time of interpretation on 10/28/2011 at 2030 hours to Dr. Preston Fleeting, who verbally acknowledged these results.   Original Report Authenticated By: Charline Bills, M.D.    US Venous Img Lower Unilateral Right  10/28/2011  *RADIOLOGY REPORT*  Clinical Data: 66 year old male with right lower extremity pain and swelling and redness.  Abnormal D-dimer.  RIGHT LOWER EXTREMITY VENOUS DUPLEX ULTRASOUND  Technique:  Gray-scale sonography with graded compression, as well as color Doppler and duplex ultrasound, were performed to evaluate the deep venous system of the lower extremity from the level of the common femoral vein through the popliteal and proximal calf veins. Spectral Doppler was utilized to evaluate flow at rest and with distal augmentation maneuvers.  Comparison:  None.  Findings: There is echogenic thrombus within the superficial femoral vein extending to the femoral vein saphenous vein junction (partially occlusive at the saphenous femoral junction, series 9 image 35).  The profunda femoral vein does not appear involved at this time.  The affected segment of vein are  not compressible and show no Doppler or spectral flow.  The right popliteal vein is affected.  The posterior tibial veins to the level of the mid calf are occluded as well.  IMPRESSION: Positive for deep venous thrombosis from the level of the right common  femoral vein/saphenous femoral vein junction to the right calf.  These results will be called to the ordering clinician or representative by the Radiologist Assistant, and communication documented in the PACS Dashboard.   Original Report Authenticated By: Harley Hallmark, M.D.     Images viewed by me.  ECG shows normal sinus rhythm with a rate of 62, no ectopy. Normal axis. Normal P wave. Normal QRS. Normal intervals. Normal ST and T waves. Impression: normal ECG. No prior ECG available for comparison   1. Pulmonary embolism   2. DVT (deep venous thrombosis)       MDM  Venous Doppler report was reviewed and he does indeed have a DVT in his right lower extremity. Given his worsening of baseline dyspnea, CT angiogram of the chest will be obtained to rule out concurrent pulmonary emboli. Initial dose of Lovenox is given. The patient does give himself subcutaneous injections of Humira, so if he does not have pulmonary emboli, he will be able to inject himself with Lovenox and should be able to be treated as an outpatient. If CT angiogram is positive for pulmonary embolism, he will need to be admitted.  CT scan shows extensive bilateral pulmonary emboli. In spite of large clot burden, he is hemodynamically stable and is not a candidate for thrombolytic therapy. Case is discussed with Dr. Verta Ellen [facility] who agrees to accept him in transfer to a step down bed.       Dione Booze, MD 10/28/11 2116

## 2011-10-28 NOTE — Progress Notes (Signed)
Called by Dr. Preston Fleeting to admit the pt from [facility] med center, pt 66 yo male with new diagnosis of pulmonary emboli. I asked for admission to SDU for tonight. Pt is hemodynamically stable for transfer to [facility]. Debbora Presto, MD  [facility] Pager [phone]  If 7PM-7AM, please contact night-coverage [email or web address] Password [email or web address]

## 2011-10-28 NOTE — Telephone Encounter (Signed)
Dr. Linford Arnold notified of D-dimer 5.90  Per her request patient need Venous Doppler study ASAP Patient advised to go to [facility] now No prior autho needed on insuurance

## 2011-10-28 NOTE — Telephone Encounter (Signed)
No PA required for pt's insurance.

## 2011-10-29 DIAGNOSIS — L570 Actinic keratosis: Secondary | ICD-10-CM

## 2011-10-29 DIAGNOSIS — I2699 Other pulmonary embolism without acute cor pulmonale: Secondary | ICD-10-CM

## 2011-10-29 LAB — PROTIME-INR
INR: 1 (ref 0.00–1.49)
Prothrombin Time: 13.1 seconds (ref 11.6–15.2)

## 2011-10-29 LAB — HEMOGLOBIN A1C: Hgb A1c MFr Bld: 7.1 % — ABNORMAL HIGH (ref <5.7)

## 2011-10-29 LAB — BASIC METABOLIC PANEL
Calcium: 10.1 mg/dL (ref 8.4–10.5)
GFR calc Af Amer: >90 mL/min (ref >90)
GFR calc non Af Amer: >90 mL/min (ref >90)
Potassium: 3.7 mEq/L (ref 3.5–5.1)
Sodium: 138 mEq/L (ref 135–145)

## 2011-10-29 LAB — GLUCOSE, CAPILLARY: Glucose-Capillary: 131 mg/dL — ABNORMAL HIGH (ref 70–99)

## 2011-10-29 LAB — MRSA PCR SCREENING: MRSA by PCR: NEGATIVE

## 2011-10-29 LAB — CBC
Hemoglobin: 13.2 g/dL (ref 13.0–17.0)
MCH: 29.2 pg (ref 26.0–34.0)
MCHC: 34 g/dL (ref 30.0–36.0)
Platelets: 186 10*3/uL (ref 150–400)
RBC: 4.52 MIL/uL (ref 4.22–5.81)

## 2011-10-29 MED ORDER — WARFARIN SODIUM 10 MG PO TABS
10.0000 mg | ORAL_TABLET | Freq: Once | ORAL | Status: AC
  Administered 2011-10-29: 10 mg via ORAL
  Filled 2011-10-29: qty 1

## 2011-10-29 MED ORDER — WARFARIN VIDEO
Freq: Once | Status: AC
  Administered 2011-10-30: 12:00:00

## 2011-10-29 MED ORDER — PATIENT'S GUIDE TO USING COUMADIN BOOK
Freq: Once | Status: AC
  Administered 2011-10-29: 18:00:00
  Filled 2011-10-29: qty 1
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
## 2011-10-29 NOTE — Progress Notes (Signed)
PT Evaluation Cancellation Note:  Pt with bilateral PEs and right LE DVTs.  Pt currently on high dose lovenox and coumadin.  INR at 1.0 for today with goal 2-3.  Will hold PT evaluation for today and follow up tomorrow 10/30/11.  Thanks.  10/29/2011 Cephus Shelling, PT, DPT [phone]

## 2011-10-29 NOTE — Progress Notes (Signed)
ANTICOAGULATION CONSULT NOTE - Follow-Up Consult  Pharmacy Consult for Lovenox/Coumadin Indication: RLE DVT with B/L PE  Allergies  Allergen Reactions  . Atorvastatin Other (See Comments)    REACTION: elevated transaminases  . Flurosyn (Fluocinolone) Other (See Comments)    Made eyes burn.    Patient Measurements: Height: 5\' 8"  (172.7 cm) Weight: 243 lb 6.2 oz (110.4 kg) IBW/kg (Calculated) : 68.4   Vital Signs: Temp: 97.7 F (36.5 C) (09/26 0737) Temp src: Oral (09/26 0737) BP: 116/58 mmHg (09/26 0737) Pulse Rate: 74  (09/26 0400)  Labs:  Basename 10/29/11 0440 10/28/11 2327 10/28/11 1925 10/28/11 1020  HGB 13.2 -- 13.2 --  HCT 38.8* -- 38.0* 39.9  PLT 186 -- 163 191  APTT -- -- -- --  LABPROT 13.1 -- 12.8 --  INR 1.00 -- 0.97 --  HEPARINUNFRC -- -- -- --  CREATININE 0.65 -- 0.60 0.66  CKTOTAL -- -- -- --  CKMB -- -- -- --  TROPONINI <0.30 <0.30 -- --    Estimated Creatinine Clearance: 112.4 ml/min (by C-G formula based on Cr of 0.65).  Assessment: 66 y/o male patient on Lovenox/Coumadin (Day #2/5 minimum overlap) for acute DVT with B/L PE. INR with no movement past first coumadin dose. CBC stable. CrCl stable. No bleeding noted.  Goal of Therapy:  INR 2-3 Monitor platelets by anticoagulation protocol: Yes   Plan:  1) Continue Lovenox 110mg  sq q12h 2) Coumadin 10mg  again today  3) F/u daily INR and CBC q72h while on lovenox 4) Coumadin educational book and video  Christoper Fabian, PharmD, BCPS Clinical pharmacist, pager [phone] 10/29/2011,11:10 AM

## 2011-10-29 NOTE — Progress Notes (Signed)
Pt report called to 5500, VSS. Will continue to monitor.

## 2011-10-29 NOTE — Progress Notes (Signed)
Francisco Moore is a 66 y.o. male patient who transferred  From 3300 awake, alert  & orientated  X 3, Full Code, VSS - Blood pressure 133/84, pulse 65, temperature 97.4 F (36.3 C), temperature source Oral, resp. rate 18, height 5\' 8"  (1.727 m), weight 110.9 kg (244 lb 7.8 oz), SpO2 97.00%.,no c/o shortness of breath, no c/o chest pain, no distress noted. Tele # [id number]  placed and pt is currently running:normal sinus rhythm.   IV site WDL: forearm left, condition patent and no redness with a transparent dsg that's clean dry and intact.  Allergies:   Allergies  Allergen Reactions  . Atorvastatin Other (See Comments)    REACTION: elevated transaminases  . Flurosyn (Fluocinolone) Other (See Comments)    Made eyes burn.     Past Medical History  Diagnosis Date  . CAD (coronary artery disease)     [place] [facility]- Dr. Philis Kendall & Dr. Arnoldo Lenis   . Rheumatoid arthritis     Dr. Jimmy Footman   . 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 Skin cancer   . Elevated LFTs   . Diabetes mellitus   . Acid reflux   . Hypertension     Pt orientation to unit, room and routine. SR up x 2, fall risk assessment complete with Patient and family verbalizing understanding of risks associated with falls. Pt verbalizes an understanding of how to use the call bell and to call for help before getting out of bed.  Skin, clean-dry- intact without evidence of bruising, or skin tears.   Right LE is red , warm and slightly swollen from a DVT.     Will cont to monitor and assist as needed.  Cindra Eves, RN 10/29/2011 6:56 PM

## 2011-10-29 NOTE — Progress Notes (Signed)
Utilization review completed.  

## 2011-10-30 DIAGNOSIS — I2510 Atherosclerotic heart disease of native coronary artery without angina pectoris: Secondary | ICD-10-CM

## 2011-10-30 DIAGNOSIS — I10 Essential (primary) hypertension: Secondary | ICD-10-CM

## 2011-10-30 DIAGNOSIS — I82409 Acute embolism and thrombosis of unspecified deep veins of unspecified lower extremity: Secondary | ICD-10-CM

## 2011-10-30 LAB — PROTIME-INR: Prothrombin Time: 14.1 seconds (ref 11.6–15.2)

## 2011-10-30 MED ORDER — MENTHOL 3 MG MT LOZG
1.0000 | LOZENGE | OROMUCOSAL | Status: DC | PRN
  Administered 2011-10-30 – 2011-10-31 (×2): 3 mg via ORAL
  Filled 2011-10-30 (×2): qty 9

## 2011-10-30 MED ORDER — GUAIFENESIN-DM 100-10 MG/5ML PO SYRP
5.0000 mL | ORAL_SOLUTION | ORAL | Status: DC | PRN
  Administered 2011-10-31: 5 mL via ORAL
  Filled 2011-10-30 (×2): qty 5

## 2011-10-30 MED ORDER — WARFARIN SODIUM 10 MG PO TABS
10.0000 mg | ORAL_TABLET | Freq: Once | ORAL | Status: AC
  Administered 2011-10-30: 10 mg via ORAL
  Filled 2011-10-30: qty 1

## 2011-10-30 MED ORDER — INSULIN ASPART 100 UNIT/ML ~~LOC~~ SOLN
0.0000 [IU] | Freq: Three times a day (TID) | SUBCUTANEOUS | Status: DC
  Administered 2011-10-31: 2 [IU] via SUBCUTANEOUS
  Administered 2011-10-31: 1 [IU] via SUBCUTANEOUS
  Administered 2011-10-31 – 2011-11-01 (×2): 2 [IU] via SUBCUTANEOUS

## 2011-10-30 NOTE — Progress Notes (Signed)
PATIENT DETAILS Name: Francisco Moore Age: 66 y.o. Sex: male Date of Birth: Mar 08, 1945 Admit Date: 10/28/2011 Admitting Physician Dorothea Ogle, MD ZOX:WRUEA, KAREN, DO  Subjective: No major issues overnight  Assessment/Plan: Principal Problem:  *Saddle pulmonary embolus with DVT -remarkably stable for large clot burden -will continue with Lovenox and Coumadin -begin lovenox and coumadin teaching  Active Problems: CAD  -per patient last stent placed 2007-2008 -follows with cards in [facility] -left message for primary cards in [phone] -on dual anti-platelet therapy with ASA/Plavix, now given need for anticoagulation-will need to stop one agent, will d/w primary cards when I get call back   DIABETES MELLITUS II, UNCOMPLICATED -monitor CBG's -start SSI -hold metformin while inpatient -c/w Amaryl   OBESITY, NOS -BMI 37.3 -counseled importance of weight loss   HYPERTENSION, BENIGN SYSTEMIC -controlled with Ramipril   RHEUMATOID ARTHRITIS -stable -c/w methylprednisone -resume Humira on discharge  HYPERLIPIDEMIA  *Patient endorses previously statin intolerance with associated severe transaminitis  Disposition: Remain inpatient  DVT Prophylaxis: Not needed as on full dose anticoagulation  Code Status: Full code  Procedures:  None  CONSULTS:  None  PHYSICAL EXAM: Vital signs in last 24 hours: Filed Vitals:   10/29/11 1122 10/29/11 1524 10/29/11 2100 10/30/11 0525  BP:  133/84 129/82 116/70  Pulse:  65 74 68  Temp: 97.8 F (36.6 C) 97.4 F (36.3 C) 98.3 F (36.8 C) 97.8 F (36.6 C)  TempSrc: Oral Oral Oral   Resp:  18 19 19   Height:  5\' 8"  (1.727 m)    Weight:  110.9 kg (244 lb 7.8 oz)  108.9 kg (240 lb 1.3 oz)  SpO2:  97% 98% 95%    Weight change: -2.046 kg (-4 lb 8.2 oz) Body mass index is 36.50 kg/(m^2).   Gen Exam: Awake and alert with clear speech.   Neck: Supple, No JVD.   Chest: B/L Clear.   CVS: S1 S2 Regular, no murmurs.    Abdomen: soft, BS +, non tender, non distended.  Extremities: no edema, lower extremities warm to touch. Neurologic: Non Focal.   Skin: No Rash.  Wounds: N/A.    Intake/Output from previous day:  Intake/Output Summary (Last 24 hours) at 10/30/11 1254 Last data filed at 10/29/11 1500  Gross per 24 hour  Intake      0 ml  Output    400 ml  Net   -400 ml     LAB RESULTS: CBC  Lab 10/29/11 0440 10/28/11 1925 10/28/11 1020  WBC 7.3 7.8 7.2  HGB 13.2 13.2 13.6  HCT 38.8* 38.0* 39.9  PLT 186 163 191  MCV 85.8 84.4 84.9  MCH 29.2 29.3 28.9  MCHC 34.0 34.7 34.1  RDW 13.1 12.9 13.6  LYMPHSABS -- 1.6 2.0  MONOABS -- 0.6 0.6  EOSABS -- 0.2 0.3  BASOSABS -- 0.0 0.0  BANDABS -- -- --    Chemistries   Lab 10/29/11 0440 10/28/11 2327 10/28/11 1925 10/28/11 1020  NA 138 -- 135 135  K 3.7 -- 4.0 4.1  CL 98 -- 98 100  CO2 26 -- 25 25  GLUCOSE 121* -- 98 155*  BUN 10 -- 10 11  CREATININE 0.65 -- 0.60 0.66  CALCIUM 10.1 -- 10.0 9.5  MG -- 1.8 -- --    CBG:  Lab 10/29/11 0831 10/28/11 2237  GLUCAP 143* 131*    GFR Estimated Creatinine Clearance: 111.6 ml/min (by C-G formula based on Cr of 0.65).  Coagulation profile  Lab 10/30/11 0520  10/29/11 0440 10/28/11 1925  INR 1.10 1.00 0.97  PROTIME -- -- --    Cardiac Enzymes  Lab 10/29/11 1130 10/29/11 0440 10/28/11 2327  CKMB -- -- --  TROPONINI <0.30 <0.30 <0.30  MYOGLOBIN -- -- --    No components found with this basename: POCBNP:3  Basename 10/28/11 1020  DDIMER 5.90*    Basename 10/29/11 0010  HGBA1C 7.1*   No results found for this basename: CHOL:2,HDL:2,LDLCALC:2,TRIG:2,CHOLHDL:2,LDLDIRECT:2 in the last 72 hours  Basename 10/29/11 0010  TSH 3.095  T4TOTAL --  T3FREE --  THYROIDAB --   No results found for this basename: VITAMINB12:2,FOLATE:2,FERRITIN:2,TIBC:2,IRON:2,RETICCTPCT:2 in the last 72 hours No results found for this basename: LIPASE:2,AMYLASE:2 in the last 72 hours  Urine Studies No  results found for this basename: UACOL:2,UAPR:2,USPG:2,UPH:2,UTP:2,UGL:2,UKET:2,UBIL:2,UHGB:2,UNIT:2,UROB:2,ULEU:2,UEPI:2,UWBC:2,URBC:2,UBAC:2,CAST:2,CRYS:2,UCOM:2,BILUA:2 in the last 72 hours  MICROBIOLOGY: Recent Results (from the past 240 hour(s))  MRSA PCR SCREENING     Status: Normal   Collection Time   10/28/11 11:16 PM      Component Value Range Status Comment   MRSA by PCR NEGATIVE  NEGATIVE Final     RADIOLOGY STUDIES/RESULTS: Dg Chest 2 View  10/28/2011  *RADIOLOGY REPORT*  Clinical Data: Cough.  Shortness of breath.  CHEST - 2 VIEW  Comparison: None.  Findings: Thoracic spondylosis is observed.  Airway thickening may reflect bronchitis or reactive airways disease.  No airspace opacity is identified to suggest bacterial pneumonia pattern.  Cardiac and mediastinal contours appear unremarkable.  IMPRESSION:  1. Airway thickening may reflect bronchitis or reactive airways disease.  No airspace opacity is identified to suggest bacterial pneumonia pattern. 2.  Thoracic spondylosis.   Original Report Authenticated By: Dellia Cloud, M.D.    Ct Angio Chest W/cm &/or Wo Cm  10/28/2011  *RADIOLOGY REPORT*  Clinical Data: Shortness of breath, elevated D-dimer  CT ANGIOGRAPHY CHEST  Technique:  Multidetector CT imaging of the chest using the standard protocol during bolus administration of intravenous contrast. Multiplanar reconstructed images including MIPs were obtained and reviewed to evaluate the vascular anatomy.  Contrast: 80mL OMNIPAQUE IOHEXOL 350 MG/ML SOLN  Comparison: Chest radiographs dated 10/28/2011  Findings: Pulmonary embolism at the bifurcation of the right and left pulmonary arteries (saddle embolus).  Small amount of thrombus extends into the left upper lobe pulmonary artery (series 5/image 107). Moderate thrombus at the origin of the right main pulmonary artery and extending primarily into multiple branches of the right lower lobe pulmonary artery (series 5/image 133).   Overall clot burden is moderate to large.  Minimal subpleural reticulation/dependent atelectasis in the bilateral lower lobes.  7 mm nodule in the medial left lower lobe (series 6/image 66). No pleural effusion or pneumothorax.  Visualized thyroid is unremarkable.  The heart is normal in size.  No pericardial effusion.  Coronary atherosclerosis.  Atherosclerotic calcifications of the aortic arch.  No suspicious mediastinal, hilar, or axillary lymphadenopathy.  Visualized upper abdomen is unremarkable.  Degenerative changes of the visualized thoracolumbar spine.  IMPRESSION: Bilateral pulmonary emboli, as described above, predominantly centrally within the right lower lobe pulmonary artery.  Overall clot burden is moderate to large.  7 mm left lower lobe nodule.  If this patient is high risk for primary bronchogenic carcinoma, initial follow-up CT chest is suggested in 3-6 months.  Otherwise, follow-up CT is suggested in 6- 12 months.  This recommendation follows the consensus statement: Guidelines for Management of Small Pulmonary Nodules Detected on CT Scans:  A Statement from the Fleischner Society as published in Radiology 2005;  119:147-829.  Critical Value/emergent results were called by telephone at the time of interpretation on 10/28/2011 at 2030 hours to Dr. Preston Fleeting, who verbally acknowledged these results.   Original Report Authenticated By: Charline Bills, M.D.    US Venous Img Lower Unilateral Right  10/28/2011  *RADIOLOGY REPORT*  Clinical Data: 66 year old male with right lower extremity pain and swelling and redness.  Abnormal D-dimer.  RIGHT LOWER EXTREMITY VENOUS DUPLEX ULTRASOUND  Technique:  Gray-scale sonography with graded compression, as well as color Doppler and duplex ultrasound, were performed to evaluate the deep venous system of the lower extremity from the level of the common femoral vein through the popliteal and proximal calf veins. Spectral Doppler was utilized to evaluate flow at  rest and with distal augmentation maneuvers.  Comparison:  None.  Findings: There is echogenic thrombus within the superficial femoral vein extending to the femoral vein saphenous vein junction (partially occlusive at the saphenous femoral junction, series 9 image 35).  The profunda femoral vein does not appear involved at this time.  The affected segment of vein are not compressible and show no Doppler or spectral flow.  The right popliteal vein is affected.  The posterior tibial veins to the level of the mid calf are occluded as well.  IMPRESSION: Positive for deep venous thrombosis from the level of the right common femoral vein/saphenous femoral vein junction to the right calf.  These results will be called to the ordering clinician or representative by the Radiologist Assistant, and communication documented in the PACS Dashboard.   Original Report Authenticated By: Harley Hallmark, M.D.     MEDICATIONS: Scheduled Meds:   . aspirin  81 mg Oral Daily  . clopidogrel  75 mg Oral Q breakfast  . enoxaparin (LOVENOX) injection  110 mg Subcutaneous Q12H  . glimepiride  4 mg Oral QAC breakfast  . methylPREDNISolone  4 mg Oral Q breakfast  . mirtazapine  30 mg Oral QHS  . pantoprazole  40 mg Oral Q1200  . patient's guide to using coumadin book   Does not apply Once  . ramipril  10 mg Oral Daily  . sodium chloride  3 mL Intravenous Q12H  . warfarin  10 mg Oral ONCE-1800  . warfarin  10 mg Oral ONCE-1800  . warfarin   Does not apply Once  . Warfarin - Pharmacist Dosing Inpatient   Does not apply q1800  . DISCONTD: metFORMIN  500 mg Oral BID WC   Continuous Infusions:  PRN Meds:.HYDROcodone-acetaminophen, morphine injection, nitroGLYCERIN, ondansetron (ZOFRAN) IV, ondansetron, sodium chloride  Antibiotics: Anti-infectives    None       Jeoffrey Massed, MD  [facility] Pager:[phone]  If 7PM-7AM, please contact night-coverage [email or web address] Password [email or web address] 10/30/2011,  12:54 PM   LOS: 2 days

## 2011-10-30 NOTE — Progress Notes (Signed)
PT Cancellation Note  Treatment cancelled today due to medical issues with patient which prohibited therapy.Pt with bil PE and DVT on coumadin and lovenox with INR only 1.1 and not yet therapeutic for activity. Will attempt eval next date. Thanks Delaney Meigs, PT [phone]

## 2011-10-31 DIAGNOSIS — M069 Rheumatoid arthritis, unspecified: Secondary | ICD-10-CM

## 2011-10-31 LAB — GLUCOSE, CAPILLARY
Glucose-Capillary: 119 mg/dL — ABNORMAL HIGH (ref 70–99)
Glucose-Capillary: 155 mg/dL — ABNORMAL HIGH (ref 70–99)

## 2011-10-31 LAB — PROTIME-INR: INR: 1.4 (ref 0.00–1.49)

## 2011-10-31 MED ORDER — CLOPIDOGREL BISULFATE 75 MG PO TABS
75.0000 mg | ORAL_TABLET | Freq: Every day | ORAL | Status: DC
  Administered 2011-10-31 – 2011-11-01 (×2): 75 mg via ORAL
  Filled 2011-10-31 (×3): qty 1

## 2011-10-31 MED ORDER — WARFARIN SODIUM 10 MG PO TABS
10.0000 mg | ORAL_TABLET | Freq: Once | ORAL | Status: AC
  Filled 2011-10-31: qty 1

## 2011-10-31 MED ORDER — METHYLPREDNISOLONE 4 MG PO TABS
4.0000 mg | ORAL_TABLET | Freq: Every day | ORAL | Status: DC
  Administered 2011-10-31 – 2011-11-01 (×2): 4 mg via ORAL
  Filled 2011-10-31 (×3): qty 1

## 2011-10-31 NOTE — Progress Notes (Signed)
One-Time Physical Therapy Evaluation Patient Details Name: Francisco Moore MRN: [id number] DOB: 04-20-1945 Today's Date: 10/31/2011 Time: 1435-1500 PT Time Calculation (min): 25 min  PT Assessment / Plan / Recommendation Clinical Impression  Pt is 66 yo male with PE and right LE DVT who is independent with mobility and does not currently need PT/ OT acutely or on a f/u basis.  PT signing off.    PT Assessment  Patent does not need any further PT services    Follow Up Recommendations  No PT follow up    Barriers to Discharge        Equipment Recommendations  None recommended by PT    Recommendations for Other Services     Frequency      Precautions / Restrictions Precautions Precautions: None Restrictions Weight Bearing Restrictions: No   Pertinent Vitals/Pain C/o soreness left shoulder      Mobility  Bed Mobility Bed Mobility: Supine to Sit;Sit to Supine Supine to Sit: 7: Independent Sit to Supine: 7: Independent Transfers Transfers: Sit to Stand;Stand to Sit Sit to Stand: 7: Independent Stand to Sit: 7: Independent Ambulation/Gait Ambulation/Gait Assistance: 6: Modified independent (Device/Increase time) Ambulation Distance (Feet): 500 Feet Assistive device: None Ambulation/Gait Assistance Details: pt ambulates with increased sway but this appears to be his baseline Gait Pattern: Step-through pattern;Wide base of support Gait velocity: WFL Stairs: No Wheelchair Mobility Wheelchair Mobility: No    Shoulder Instructions     Exercises     PT Diagnosis:    PT Problem List:   PT Treatment Interventions:     PT Goals    Visit Information  Last PT Received On: 10/31/11 Assistance Needed: +1    Subjective Data  Subjective: I play 13 rounds of golf a week in [place] Patient Stated Goal: return home   Prior Functioning  Home Living Lives With: Spouse Available Help at Discharge: Family Type of Home: House Home Layout: One level Home Adaptive  Equipment: None Additional Comments: Pt was independent PTA, riding motorcycle, golfing, etc  Prior Function Level of Independence: Independent Able to Take Stairs?: Yes Driving: Yes Vocation: Retired Musician: No difficulties (hearing aids)    Cognition  Overall Cognitive Status: Appears within functional limits for tasks assessed/performed Arousal/Alertness: Awake/alert Orientation Level: Oriented X4 / Intact Behavior During Session: WFL for tasks performed    Extremity/Trunk Assessment Right Upper Extremity Assessment RUE ROM/Strength/Tone: WFL for tasks assessed RUE Sensation: WFL - Light Touch RUE Coordination: WFL - gross motor Left Upper Extremity Assessment LUE ROM/Strength/Tone: Deficits LUE ROM/Strength/Tone Deficits: deficits due to RA flare-up since admission LUE Sensation: WFL - Light Touch LUE Coordination: WFL - gross motor Right Lower Extremity Assessment RLE ROM/Strength/Tone: Deficits RLE ROM/Strength/Tone Deficits: calf still feels slightly warm to the touch with some discoloration but pt reports that it is much improved since admission.  No new strength deficits, bilateral knees affected by RA RLE Sensation: WFL - Light Touch RLE Coordination: WFL - gross motor Left Lower Extremity Assessment LLE ROM/Strength/Tone: WFL for tasks assessed LLE Sensation: WFL - Light Touch LLE Coordination: WFL - gross motor Trunk Assessment Trunk Assessment: Normal   Balance Balance Balance Assessed: Yes Dynamic Standing Balance Dynamic Standing - Balance Support: No upper extremity supported;During functional activity Dynamic Standing - Level of Assistance: 6: Modified independent (Device/Increase time)  End of Session PT - End of Session Activity Tolerance: Patient tolerated treatment well Patient left: in bed;with family/visitor present;with call bell/phone within reach Nurse Communication: Mobility status  GP  Lyanne Co, PT  Acute Rehab  Services  [phone]   Lyanne Co 10/31/2011, 3:49 PM

## 2011-10-31 NOTE — Progress Notes (Signed)
ANTICOAGULATION CONSULT NOTE - Follow-Up Consult  Pharmacy Consult for Lovenox/Coumadin Indication: RLE DVT with B/L PE  Allergies  Allergen Reactions  . Atorvastatin Other (See Comments)    REACTION: elevated transaminases  . Flurosyn (Fluocinolone) Other (See Comments)    Made eyes burn.    Patient Measurements: Height: 5\' 8"  (172.7 cm) Weight: 237 lb 7 oz (107.7 kg) IBW/kg (Calculated) : 68.4   Vital Signs: Temp: 98.2 F (36.8 C) (09/28 0550) Temp src: Oral (09/28 0550) BP: 145/77 mmHg (09/28 0550) Pulse Rate: 82  (09/28 0550)  Labs:  Alvira Philips 10/31/11 0804 10/30/11 0520 10/29/11 1130 10/29/11 0440 10/28/11 2327 10/28/11 1925  HGB -- -- -- 13.2 -- 13.2  HCT -- -- -- 38.8* -- 38.0*  PLT -- -- -- 186 -- 163  APTT -- -- -- -- -- --  LABPROT 16.8* 14.1 -- 13.1 -- --  INR 1.40 1.10 -- 1.00 -- --  HEPARINUNFRC -- -- -- -- -- --  CREATININE -- -- -- 0.65 -- 0.60  CKTOTAL -- -- -- -- -- --  CKMB -- -- -- -- -- --  TROPONINI -- -- <0.30 <0.30 <0.30 --    Estimated Creatinine Clearance: 111 ml/min (by C-G formula based on Cr of 0.65).  Assessment: 66 y/o male patient on Lovenox/Coumadin (Day #4/5 minimum overlap) for acute DVT with B/L PE. INR up  to 1.4 from 1.1 after third 10mg  dose of Coumadin. CBC stable. CrCl stable. No bleeding noted.  Goal of Therapy:  INR 2-3 Monitor platelets by anticoagulation protocol: Yes   Plan:  1) Continue Lovenox 110mg  sq q12h 2) Coumadin 10mg  today  3) F/u daily INR and CBC q72h while on lovenox   Dalyn Kjos R. Darin Engels.D. Clinical Pharmacist Pager [phone] Phone [phone] 10/31/2011 2:47 PM

## 2011-10-31 NOTE — Plan of Care (Signed)
Problem: Discharge Progression Outcomes Goal: Flu vaccine received if indicated Outcome: Completed/Met Date Met:  10/31/11 10/28/11

## 2011-10-31 NOTE — Plan of Care (Signed)
Problem: Discharge Progression Outcomes Goal: Independent ADLs or Home Health Care Outcome: Completed/Met Date Met:  10/31/11 Independent

## 2011-10-31 NOTE — Plan of Care (Signed)
Problem: Discharge Progression Outcomes Goal: Pneumonia vaccine received if indicated Outcome: Completed/Met Date Met:  10/31/11 Pt refused

## 2011-10-31 NOTE — Progress Notes (Signed)
PATIENT DETAILS Name: Francisco Moore Age: 66 y.o. Sex: male Date of Birth: November 23, 1945 Admit Date: 10/28/2011 Admitting Physician Dorothea Ogle, MD ZOX:WRUEA, KAREN, DO  Subjective: No major issues overnight  Assessment/Plan: Principal Problem:  *Saddle pulmonary embolus with DVT -remarkably stable for large clot burden -will continue with Lovenox and Coumadin -begin lovenox and coumadin teaching -INR slightly up today  Active Problems: CAD  -per patient last stent placed 2007-2008 -follows with cards in [facility] -left message for primary cards in [place]-[phone] -on dual anti-platelet therapy with ASA/Plavix, now given need for anticoagulation-will need to stop one agent, will d/w primary cards when I get call back-unfortunately patient's cardiologist is on vacation. No other covering cardiologist was available yesterday   DIABETES MELLITUS II, UNCOMPLICATED -monitor CBG's -start SSI -hold metformin while inpatient -c/w Amaryl   OBESITY, NOS -BMI 37.3 -counseled importance of weight loss   HYPERTENSION, BENIGN SYSTEMIC -controlled with Ramipril   RHEUMATOID ARTHRITIS -stable -c/w methylprednisone -resume Humira on discharge  HYPERLIPIDEMIA  *Patient endorses previously statin intolerance with associated severe transaminitis  Disposition: Remain inpatient-perhaps d/c in am, depending on INR  DVT Prophylaxis: Not needed as on full dose anticoagulation  Code Status: Full code  Procedures:  None  CONSULTS:  None  PHYSICAL EXAM: Vital signs in last 24 hours: Filed Vitals:   10/30/11 0525 10/30/11 1329 10/30/11 2113 10/31/11 0550  BP: 116/70 137/84 131/76 145/77  Pulse: 68 69 72 82  Temp: 97.8 F (36.6 C) 97.6 F (36.4 C) 98.2 F (36.8 C) 98.2 F (36.8 C)  TempSrc:  Oral Oral Oral  Resp: 19 19 20 18   Height:      Weight: 108.9 kg (240 lb 1.3 oz)   107.7 kg (237 lb 7 oz)  SpO2: 95% 92% 96% 91%    Weight change: -3.2 kg (-7 lb 0.9 oz) Body mass  index is 36.10 kg/(m^2).   Gen Exam: Awake and alert with clear speech.   Neck: Supple, No JVD.   Chest: B/L Clear.   CVS: S1 S2 Regular, no murmurs.  Abdomen: soft, BS +, non tender, non distended.  Extremities: no edema, lower extremities warm to touch. Neurologic: Non Focal.   Skin: No Rash.  Wounds: N/A.    Intake/Output from previous day:  Intake/Output Summary (Last 24 hours) at 10/31/11 1120 Last data filed at 10/30/11 2038  Gross per 24 hour  Intake    600 ml  Output      0 ml  Net    600 ml     LAB RESULTS: CBC  Lab 10/29/11 0440 10/28/11 1925 10/28/11 1020  WBC 7.3 7.8 7.2  HGB 13.2 13.2 13.6  HCT 38.8* 38.0* 39.9  PLT 186 163 191  MCV 85.8 84.4 84.9  MCH 29.2 29.3 28.9  MCHC 34.0 34.7 34.1  RDW 13.1 12.9 13.6  LYMPHSABS -- 1.6 2.0  MONOABS -- 0.6 0.6  EOSABS -- 0.2 0.3  BASOSABS -- 0.0 0.0  BANDABS -- -- --    Chemistries   Lab 10/29/11 0440 10/28/11 2327 10/28/11 1925 10/28/11 1020  NA 138 -- 135 135  K 3.7 -- 4.0 4.1  CL 98 -- 98 100  CO2 26 -- 25 25  GLUCOSE 121* -- 98 155*  BUN 10 -- 10 11  CREATININE 0.65 -- 0.60 0.66  CALCIUM 10.1 -- 10.0 9.5  MG -- 1.8 -- --    CBG:  Lab 10/31/11 0752 10/30/11 2147 10/30/11 1559 10/29/11 0831 10/28/11 2237  GLUCAP 155*  119* 168* 143* 131*    GFR Estimated Creatinine Clearance: 111 ml/min (by C-G formula based on Cr of 0.65).  Coagulation profile  Lab 10/31/11 0804 10/30/11 0520 10/29/11 0440 10/28/11 1925  INR 1.40 1.10 1.00 0.97  PROTIME -- -- -- --    Cardiac Enzymes  Lab 10/29/11 1130 10/29/11 0440 10/28/11 2327  CKMB -- -- --  TROPONINI <0.30 <0.30 <0.30  MYOGLOBIN -- -- --    No components found with this basename: POCBNP:3 No results found for this basename: DDIMER:2 in the last 72 hours  Basename 10/29/11 0010  HGBA1C 7.1*   No results found for this basename: CHOL:2,HDL:2,LDLCALC:2,TRIG:2,CHOLHDL:2,LDLDIRECT:2 in the last 72 hours  Basename 10/29/11 0010  TSH 3.095    T4TOTAL --  T3FREE --  THYROIDAB --   No results found for this basename: VITAMINB12:2,FOLATE:2,FERRITIN:2,TIBC:2,IRON:2,RETICCTPCT:2 in the last 72 hours No results found for this basename: LIPASE:2,AMYLASE:2 in the last 72 hours  Urine Studies No results found for this basename: UACOL:2,UAPR:2,USPG:2,UPH:2,UTP:2,UGL:2,UKET:2,UBIL:2,UHGB:2,UNIT:2,UROB:2,ULEU:2,UEPI:2,UWBC:2,URBC:2,UBAC:2,CAST:2,CRYS:2,UCOM:2,BILUA:2 in the last 72 hours  MICROBIOLOGY: Recent Results (from the past 240 hour(s))  MRSA PCR SCREENING     Status: Normal   Collection Time   10/28/11 11:16 PM      Component Value Range Status Comment   MRSA by PCR NEGATIVE  NEGATIVE Final     RADIOLOGY STUDIES/RESULTS: Dg Chest 2 View  10/28/2011  *RADIOLOGY REPORT*  Clinical Data: Cough.  Shortness of breath.  CHEST - 2 VIEW  Comparison: None.  Findings: Thoracic spondylosis is observed.  Airway thickening may reflect bronchitis or reactive airways disease.  No airspace opacity is identified to suggest bacterial pneumonia pattern.  Cardiac and mediastinal contours appear unremarkable.  IMPRESSION:  1. Airway thickening may reflect bronchitis or reactive airways disease.  No airspace opacity is identified to suggest bacterial pneumonia pattern. 2.  Thoracic spondylosis.   Original Report Authenticated By: Dellia Cloud, M.D.    Ct Angio Chest W/cm &/or Wo Cm  10/28/2011  *RADIOLOGY REPORT*  Clinical Data: Shortness of breath, elevated D-dimer  CT ANGIOGRAPHY CHEST  Technique:  Multidetector CT imaging of the chest using the standard protocol during bolus administration of intravenous contrast. Multiplanar reconstructed images including MIPs were obtained and reviewed to evaluate the vascular anatomy.  Contrast: 80mL OMNIPAQUE IOHEXOL 350 MG/ML SOLN  Comparison: Chest radiographs dated 10/28/2011  Findings: Pulmonary embolism at the bifurcation of the right and left pulmonary arteries (saddle embolus).  Small amount of  thrombus extends into the left upper lobe pulmonary artery (series 5/image 107). Moderate thrombus at the origin of the right main pulmonary artery and extending primarily into multiple branches of the right lower lobe pulmonary artery (series 5/image 133).  Overall clot burden is moderate to large.  Minimal subpleural reticulation/dependent atelectasis in the bilateral lower lobes.  7 mm nodule in the medial left lower lobe (series 6/image 66). No pleural effusion or pneumothorax.  Visualized thyroid is unremarkable.  The heart is normal in size.  No pericardial effusion.  Coronary atherosclerosis.  Atherosclerotic calcifications of the aortic arch.  No suspicious mediastinal, hilar, or axillary lymphadenopathy.  Visualized upper abdomen is unremarkable.  Degenerative changes of the visualized thoracolumbar spine.  IMPRESSION: Bilateral pulmonary emboli, as described above, predominantly centrally within the right lower lobe pulmonary artery.  Overall clot burden is moderate to large.  7 mm left lower lobe nodule.  If this patient is high risk for primary bronchogenic carcinoma, initial follow-up CT chest is suggested in 3-6 months.  Otherwise, follow-up CT  is suggested in 6- 12 months.  This recommendation follows the consensus statement: Guidelines for Management of Small Pulmonary Nodules Detected on CT Scans:  A Statement from the Fleischner Society as published in Radiology 2005; 237:395-400.  Critical Value/emergent results were called by telephone at the time of interpretation on 10/28/2011 at 2030 hours to Dr. Preston Fleeting, who verbally acknowledged these results.   Original Report Authenticated By: Charline Bills, M.D.    US Venous Img Lower Unilateral Right  10/28/2011  *RADIOLOGY REPORT*  Clinical Data: 66 year old male with right lower extremity pain and swelling and redness.  Abnormal D-dimer.  RIGHT LOWER EXTREMITY VENOUS DUPLEX ULTRASOUND  Technique:  Gray-scale sonography with graded compression, as  well as color Doppler and duplex ultrasound, were performed to evaluate the deep venous system of the lower extremity from the level of the common femoral vein through the popliteal and proximal calf veins. Spectral Doppler was utilized to evaluate flow at rest and with distal augmentation maneuvers.  Comparison:  None.  Findings: There is echogenic thrombus within the superficial femoral vein extending to the femoral vein saphenous vein junction (partially occlusive at the saphenous femoral junction, series 9 image 35).  The profunda femoral vein does not appear involved at this time.  The affected segment of vein are not compressible and show no Doppler or spectral flow.  The right popliteal vein is affected.  The posterior tibial veins to the level of the mid calf are occluded as well.  IMPRESSION: Positive for deep venous thrombosis from the level of the right common femoral vein/saphenous femoral vein junction to the right calf.  These results will be called to the ordering clinician or representative by the Radiologist Assistant, and communication documented in the PACS Dashboard.   Original Report Authenticated By: Harley Hallmark, M.D.     MEDICATIONS: Scheduled Meds:    . clopidogrel  75 mg Oral Q breakfast  . enoxaparin (LOVENOX) injection  110 mg Subcutaneous Q12H  . glimepiride  4 mg Oral QAC breakfast  . insulin aspart  0-9 Units Subcutaneous TID WC  . methylPREDNISolone  4 mg Oral Q breakfast  . mirtazapine  30 mg Oral QHS  . pantoprazole  40 mg Oral Q1200  . ramipril  10 mg Oral Daily  . sodium chloride  3 mL Intravenous Q12H  . warfarin  10 mg Oral ONCE-1800  . warfarin   Does not apply Once  . Warfarin - Pharmacist Dosing Inpatient   Does not apply q1800  . DISCONTD: aspirin  81 mg Oral Daily  . DISCONTD: clopidogrel  75 mg Oral Q breakfast  . DISCONTD: methylPREDNISolone  4 mg Oral Q breakfast   Continuous Infusions:  PRN Meds:.guaiFENesin-dextromethorphan,  HYDROcodone-acetaminophen, menthol-cetylpyridinium, morphine injection, nitroGLYCERIN, ondansetron (ZOFRAN) IV, ondansetron, sodium chloride  Antibiotics: Anti-infectives    None       Jeoffrey Massed, MD  [facility] Pager:[phone]  If 7PM-7AM, please contact night-coverage [email or web address] Password [email or web address] 10/31/2011, 11:20 AM   LOS: 3 days

## 2011-10-31 NOTE — Plan of Care (Signed)
Problem: ICU Phase Progression Outcomes Goal: Initial discharge plan identified Outcome: Completed/Met Date Met:  10/31/11 Pt to return home with wife when medically cleared.

## 2011-11-01 DIAGNOSIS — E119 Type 2 diabetes mellitus without complications: Secondary | ICD-10-CM

## 2011-11-01 LAB — GLUCOSE, CAPILLARY: Glucose-Capillary: 183 mg/dL — ABNORMAL HIGH (ref 70–99)

## 2011-11-01 LAB — CBC
HCT: 43.4 % (ref 39.0–52.0)
Hemoglobin: 14.8 g/dL (ref 13.0–17.0)
MCV: 86.6 fL (ref 78.0–100.0)
RBC: 5.01 MIL/uL (ref 4.22–5.81)
RDW: 13.2 % (ref 11.5–15.5)
WBC: 7 10*3/uL (ref 4.0–10.5)

## 2011-11-01 MED ORDER — ENOXAPARIN SODIUM 100 MG/ML ~~LOC~~ SOLN: 110.0000 mg | Freq: Two times a day (BID) | SUBCUTANEOUS | Status: DC

## 2011-11-01 MED ORDER — WARFARIN SODIUM 10 MG PO TABS
10.0000 mg | ORAL_TABLET | Freq: Once | ORAL | Status: DC
  Filled 2011-11-01: qty 1

## 2011-11-01 MED ORDER — WARFARIN SODIUM 5 MG PO TABS: 10.0000 mg | ORAL_TABLET | Freq: Every day | ORAL | Status: DC

## 2011-11-01 MED ORDER — ENOXAPARIN (LOVENOX) PATIENT EDUCATION KIT
PACK | Freq: Once | Status: DC
  Filled 2011-11-01: qty 1

## 2011-11-01 NOTE — Progress Notes (Signed)
   CARE MANAGEMENT NOTE 11/01/2011  Patient:  Francisco Moore, Francisco Moore   Account Number:  [id number]  Date Initiated:  11/01/2011  Documentation initiated by:  [facility]  Subjective/Objective Assessment:     Action/Plan:   Anticipated DC Date:  11/01/2011   Anticipated DC Plan:  HOME/SELF CARE      DC Planning Services  CM consult  Medication Assistance      Choice offered to / List presented to:             Status of service:  Completed, signed off Medicare Important Message given?   (If response is "NO", the following Medicare IM given date fields will be blank) Date Medicare IM given:   Date Additional Medicare IM given:    Discharge Disposition:  HOME/SELF CARE  Per UR Regulation:    If discussed at Long Length of Stay Meetings, dates discussed:    Comments:  11/01/2011 1045 Spoke to pt and states he uses Office manager in [place]. States he is willing to pick up in [place]. Contacted his pharmacy and they did not have the 120 mg of Lovenox. Contacted [facility] on [address] in [place]. They have his dose. Driving directions added to his d/c instructions. Called pt to make him aware to pick up at [facility] on [address] in [place]. Isidoro Donning RN CCM Case Mgmt phone [phone]

## 2011-11-01 NOTE — Discharge Summary (Signed)
PATIENT DETAILS Name: Francisco Moore Age: 66 y.o. Sex: male Date of Birth: 03/08/1945 MRN: [id number]. Admit Date: 10/28/2011 Admitting Physician: Dorothea Ogle, MD WUJ:WJXBJ, KAREN, DO  Recommendations for Outpatient Follow-up:  1. PT.INR on 9/30-keep on Lovenox till INR therapeutic 2. Will need hypercoagulable and cancer screening as outpatient  PRIMARY DISCHARGE DIAGNOSIS:  Principal Problem: Pulmonary embolus Active Problems:  DIABETES MELLITUS II, UNCOMPLICATED  HYPERLIPIDEMIA  OBESITY, NOS  HYPERTENSION, BENIGN SYSTEMIC  CAD with prior stent on Plavix  RHEUMATOID ARTHRITIS (NOT JUVENILE)  DVT of deep femoral vein- the level of the right common femoral vein/saphenous femoral vein junction to the right      PAST MEDICAL HISTORY: Past Medical History  Diagnosis Date  . CAD (coronary artery disease)     [place] [facility]- Dr. Philis Kendall & Dr. Arnoldo Lenis   . Rheumatoid arthritis     Dr. Jimmy Footman   . 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 Skin cancer   . Elevated LFTs   . Diabetes mellitus   . Acid reflux   . Hypertension     DISCHARGE MEDICATIONS:   Medication List     As of 11/01/2011  9:27 AM    STOP taking these medications         aspirin EC 81 MG tablet      doxycycline 100 MG tablet   Commonly known as: VIBRA-TABS      TAKE these medications         CALCIUM + D PO   Take 1 tablet by mouth every other day. Calcium 1200mg   Vitamin D 1000mg       clopidogrel 75 MG tablet   Commonly known as: PLAVIX   Take 75 mg by mouth daily.      enoxaparin 100 MG/ML injection   Commonly known as: LOVENOX   Inject 1.1 mLs (110 mg total) into the skin every 12 (twelve) hours.      fish oil-omega-3 fatty acids 1000 MG capsule   Take 2 g by mouth daily.      glimepiride 4 MG tablet   Commonly known as: AMARYL   Take 4 mg by mouth daily before breakfast.      HUMIRA 40 MG/0.8ML injection   Generic drug: adalimumab   Inject 40 mg into the skin every 14  (fourteen) days.      metFORMIN 500 MG tablet   Commonly known as: GLUCOPHAGE   Take 500 mg by mouth 2 (two) times daily with a meal.      methylPREDNISolone 4 MG tablet   Commonly known as: MEDROL   Take 4 mg by mouth daily.      mirtazapine 30 MG tablet   Commonly known as: REMERON   Take 30 mg by mouth at bedtime as needed. As needed for insomnia, rarely used      multivitamin with minerals Tabs   Take 1 tablet by mouth every other day.      nitroGLYCERIN 0.4 MG SL tablet   Commonly known as: NITROSTAT   Place 0.4 mg under the tongue every 5 (five) minutes as needed.      omeprazole 20 MG capsule   Commonly known as: PRILOSEC   Take 20 mg by mouth daily.      ramipril 10 MG tablet   Commonly known as: ALTACE   Take 10 mg by mouth daily.      warfarin 5 MG tablet   Commonly known as: COUMADIN   Take 2  tablets (10 mg total) by mouth daily.         BRIEF HPI:  See H&P, Labs, Consult and Test reports for all details in brief, Pt is 66 yo male who presented to [facility] ED with main concern of progressively worsening right lower extremity pain, intermittent, radiating to right foot area, aggravated by walking and no specific alleviating symptoms. He also reports shortness of breath but endorses that is chronic for him and unchanged from baseline. Pt denies chest pain and no abdominal or urinary concerns. In ED, right lower extremity with DVT and pulmonary emboli noted on CT chest were confirmed and pt was transferred to [facility].  CONSULTATIONS:   {None PERTINENT RADIOLOGIC STUDIES: Dg Chest 2 View  10/28/2011  *RADIOLOGY REPORT*  Clinical Data: Cough.  Shortness of breath.  CHEST - 2 VIEW  Comparison: None.  Findings: Thoracic spondylosis is observed.  Airway thickening may reflect bronchitis or reactive airways disease.  No airspace opacity is identified to suggest bacterial pneumonia pattern.  Cardiac and mediastinal contours appear unremarkable.  IMPRESSION:  1. Airway  thickening may reflect bronchitis or reactive airways disease.  No airspace opacity is identified to suggest bacterial pneumonia pattern. 2.  Thoracic spondylosis.   Original Report Authenticated By: Dellia Cloud, M.D.    Ct Angio Chest W/cm &/or Wo Cm  10/28/2011  *RADIOLOGY REPORT*  Clinical Data: Shortness of breath, elevated D-dimer  CT ANGIOGRAPHY CHEST  Technique:  Multidetector CT imaging of the chest using the standard protocol during bolus administration of intravenous contrast. Multiplanar reconstructed images including MIPs were obtained and reviewed to evaluate the vascular anatomy.  Contrast: 80mL OMNIPAQUE IOHEXOL 350 MG/ML SOLN  Comparison: Chest radiographs dated 10/28/2011  Findings: Pulmonary embolism at the bifurcation of the right and left pulmonary arteries (saddle embolus).  Small amount of thrombus extends into the left upper lobe pulmonary artery (series 5/image 107). Moderate thrombus at the origin of the right main pulmonary artery and extending primarily into multiple branches of the right lower lobe pulmonary artery (series 5/image 133).  Overall clot burden is moderate to large.  Minimal subpleural reticulation/dependent atelectasis in the bilateral lower lobes.  7 mm nodule in the medial left lower lobe (series 6/image 66). No pleural effusion or pneumothorax.  Visualized thyroid is unremarkable.  The heart is normal in size.  No pericardial effusion.  Coronary atherosclerosis.  Atherosclerotic calcifications of the aortic arch.  No suspicious mediastinal, hilar, or axillary lymphadenopathy.  Visualized upper abdomen is unremarkable.  Degenerative changes of the visualized thoracolumbar spine.  IMPRESSION: Bilateral pulmonary emboli, as described above, predominantly centrally within the right lower lobe pulmonary artery.  Overall clot burden is moderate to large.  7 mm left lower lobe nodule.  If this patient is high risk for primary bronchogenic carcinoma, initial follow-up  CT chest is suggested in 3-6 months.  Otherwise, follow-up CT is suggested in 6- 12 months.  This recommendation follows the consensus statement: Guidelines for Management of Small Pulmonary Nodules Detected on CT Scans:  A Statement from the Fleischner Society as published in Radiology 2005; 237:395-400.  Critical Value/emergent results were called by telephone at the time of interpretation on 10/28/2011 at 2030 hours to Dr. Preston Fleeting, who verbally acknowledged these results.   Original Report Authenticated By: Charline Bills, M.D.    US Venous Img Lower Unilateral Right  10/28/2011  *RADIOLOGY REPORT*  Clinical Data: 66 year old male with right lower extremity pain and swelling and redness.  Abnormal D-dimer.  RIGHT  LOWER EXTREMITY VENOUS DUPLEX ULTRASOUND  Technique:  Gray-scale sonography with graded compression, as well as color Doppler and duplex ultrasound, were performed to evaluate the deep venous system of the lower extremity from the level of the common femoral vein through the popliteal and proximal calf veins. Spectral Doppler was utilized to evaluate flow at rest and with distal augmentation maneuvers.  Comparison:  None.  Findings: There is echogenic thrombus within the superficial femoral vein extending to the femoral vein saphenous vein junction (partially occlusive at the saphenous femoral junction, series 9 image 35).  The profunda femoral vein does not appear involved at this time.  The affected segment of vein are not compressible and show no Doppler or spectral flow.  The right popliteal vein is affected.  The posterior tibial veins to the level of the mid calf are occluded as well.  IMPRESSION: Positive for deep venous thrombosis from the level of the right common femoral vein/saphenous femoral vein junction to the right calf.  These results will be called to the ordering clinician or representative by the Radiologist Assistant, and communication documented in the PACS Dashboard.   Original  Report Authenticated By: Harley Hallmark, M.D.      PERTINENT LAB RESULTS: CBC:  Basename 11/01/11 0658  WBC 7.0  HGB 14.8  HCT 43.4  PLT 238   CMET CMP     Component Value Date/Time   NA 138 10/29/2011 0440   K 3.7 10/29/2011 0440   CL 98 10/29/2011 0440   CO2 26 10/29/2011 0440   GLUCOSE 121* 10/29/2011 0440   BUN 10 10/29/2011 0440   CREATININE 0.65 10/29/2011 0440   CREATININE 0.66 10/28/2011 1020   CALCIUM 10.1 10/29/2011 0440   PROT 7.4 10/28/2011 1020   ALBUMIN 4.2 10/28/2011 1020   AST 22 10/28/2011 1020   ALT 25 10/28/2011 1020   ALKPHOS 32* 10/28/2011 1020   BILITOT 0.5 10/28/2011 1020   GFRNONAA >90 10/29/2011 0440   GFRAA >90 10/29/2011 0440    GFR Estimated Creatinine Clearance: 111.5 ml/min (by C-G formula based on Cr of 0.65). No results found for this basename: LIPASE:2,AMYLASE:2 in the last 72 hours  Basename 10/29/11 1130  CKTOTAL --  CKMB --  CKMBINDEX --  TROPONINI <0.30   No components found with this basename: POCBNP:3 No results found for this basename: DDIMER:2 in the last 72 hours No results found for this basename: HGBA1C:2 in the last 72 hours No results found for this basename: CHOL:2,HDL:2,LDLCALC:2,TRIG:2,CHOLHDL:2,LDLDIRECT:2 in the last 72 hours No results found for this basename: TSH,T4TOTAL,FREET3,T3FREE,THYROIDAB in the last 72 hours No results found for this basename: VITAMINB12:2,FOLATE:2,FERRITIN:2,TIBC:2,IRON:2,RETICCTPCT:2 in the last 72 hours Coags:  Basename 11/01/11 0658 10/31/11 0804  INR 1.68* 1.40   Microbiology: Recent Results (from the past 240 hour(s))  MRSA PCR SCREENING     Status: Normal   Collection Time   10/28/11 11:16 PM      Component Value Range Status Comment   MRSA by PCR NEGATIVE  NEGATIVE Final      BRIEF HOSPITAL COURSE:   Principal Problem:  *Pulmonary embolus/DVT -no other risk factor-apart from recent travel -on admission-fairly large clot burden seen on CTA chest and DVT seen at  the level of the  right common femoral vein/saphenous femoral vein junction to the right on Doppler -Clinically even though large clot burden-very stable-ambulating in hallway with no difficulty, not hypoxic (not requiring O2) or not even tachycardic. -Placed on Lovenox/Coumadin combination, today -9/29-day 5 of overlapp-INR still subtherapeutic-however  INR increasing, now up to 1.68.  -Patient today requesting discharge, claims to be very comfortable injecting Lovenox, he already has a follow up appt with local PCP tomorrow, since stable and he is confident that he could get same treatment (i.e Lovenox/Coumadin) at home-I will discharge him, have him follow up with PCP tomorrow. -Hypercoagulable and cancer screening work up will need to be done in the outpatient setting.  Active Problems: CAD  -per patient last stent placed 2007-2008  -follows with cards in [facility]  -left message for primary cards in [phone]  -on dual anti-platelet therapy with ASA/Plavix, now given need for anticoagulation-will need to stop one agent, will d/w primary cards when I get call back-unfortunately patient's cardiologist is on vacation. No other covering cardiologist was available to talk to. Stop ASA, c/w Plavix till seen by Primary Cardiologist. DIABETES MELLITUS II, UNCOMPLICATED -resume home meds on discharge  OBESITY, NOS  -BMI 37.3  -counseled importance of weight loss   HYPERTENSION, BENIGN SYSTEMIC  -controlled with Ramipril   RHEUMATOID ARTHRITIS  -stable  -c/w methylprednisone  -resume Humira on discharge   HYPERLIPIDEMIA  *Patient endorses previously statin intolerance with associated severe transaminitis   TODAY-DAY OF DISCHARGE:  Subjective:   Francisco Moore today has no headache,no chest abdominal pain,no new weakness tingling or numbness, feels much better wants to go home today. He is already "dressed up" in anticipation of discharge, and is ambulating the hallway.  Objective:   Blood pressure  105/69, pulse 73, temperature 97.4 F (36.3 C), temperature source Oral, resp. rate 20, height 5\' 8"  (1.727 m), weight 108.7 kg (239 lb 10.2 oz), SpO2 93.00%.  Intake/Output Summary (Last 24 hours) at 11/01/11 0927 Last data filed at 10/31/11 1500  Gross per 24 hour  Intake    240 ml  Output      0 ml  Net    240 ml    Exam Awake Alert, Oriented *3, No new F.N deficits, Normal affect [place].AT,PERRAL Supple Neck,No JVD, No cervical lymphadenopathy appriciated.  Symmetrical Chest wall movement, Good air movement bilaterally, CTAB RRR,No Gallops,Rubs or new Murmurs, No Parasternal Heave +ve B.Sounds, Abd Soft, Non tender, No organomegaly appriciated, No rebound -guarding or rigidity. No Cyanosis, Clubbing or edema, No new Rash or bruise  DISCHARGE CONDITION: Stable  DISPOSITION: HOME  DISCHARGE INSTRUCTIONS:    Activity:  As tolerated   Diet recommendation: Diabetic Diet Heart Healthy diet       Follow-up Information    Follow up with METHENEY,CATHERINE, MD. On 11/02/2011. (9:45 for pt/inr and hospital f/u)    Contact information:   [id number]  [address]  [place] [address] [phone]         Total Time spent on discharge equals 45 minutes.  SignedJeoffrey Massed 11/01/2011 9:27 AM

## 2011-11-02 ENCOUNTER — Encounter: Payer: Self-pay | Admitting: Family Medicine

## 2011-11-02 ENCOUNTER — Ambulatory Visit (INDEPENDENT_AMBULATORY_CARE_PROVIDER_SITE_OTHER): Payer: BC Managed Care – PPO | Admitting: Family Medicine

## 2011-11-02 VITALS — BP 130/82 | HR 85 | Wt 242.0 lb

## 2011-11-02 DIAGNOSIS — I82409 Acute embolism and thrombosis of unspecified deep veins of unspecified lower extremity: Secondary | ICD-10-CM

## 2011-11-02 DIAGNOSIS — M069 Rheumatoid arthritis, unspecified: Secondary | ICD-10-CM

## 2011-11-02 DIAGNOSIS — I2699 Other pulmonary embolism without acute cor pulmonale: Secondary | ICD-10-CM

## 2011-11-02 DIAGNOSIS — I82419 Acute embolism and thrombosis of unspecified femoral vein: Secondary | ICD-10-CM

## 2011-11-02 DIAGNOSIS — I824Y9 Acute embolism and thrombosis of unspecified deep veins of unspecified proximal lower extremity: Secondary | ICD-10-CM

## 2011-11-02 DIAGNOSIS — E119 Type 2 diabetes mellitus without complications: Secondary | ICD-10-CM

## 2011-11-02 DIAGNOSIS — Z86718 Personal history of other venous thrombosis and embolism: Secondary | ICD-10-CM

## 2011-11-02 LAB — POCT INR: INR: 1.6

## 2011-11-02 NOTE — Progress Notes (Signed)
  Subjective:    Patient ID: Francisco Moore, male    DOB: 1945-05-17, 66 y.o.   MRN: [id number]  HPI WAs dx with DVT in the right leg. AFter we saw him last week and sent him for stat dopplers aver having elevated D-dimer.  Went to ED and they did a chest CT that showed PE in addition to DVT.  Was admitted at [facility], and started on coumadin and lovenox.  D/C yesterday. He is here to followup today for his INR. He says his leg swelling is much improved and his pain is better. Still feels a little short of breath with activity but not severe.  Hospital notes reviewed. Review of Systems     Objective:   Physical Exam  Constitutional: He is oriented to person, place, and time. He appears well-developed and well-nourished.  HENT:  Head: Normocephalic and atraumatic.  Cardiovascular: Normal rate, regular rhythm and normal heart sounds.   Pulmonary/Chest: Effort normal and breath sounds normal.  Musculoskeletal:       Trace edema and erythema on the right lower leg.   Neurological: He is alert and oriented to person, place, and time.  Skin: Skin is warm and dry.  Psychiatric: He has a normal mood and affect. His behavior is normal.          Assessment & Plan:  DVT/Bilateral DVT - See coumadin flowsheet today.  He is still not therapeutic. Continue 10 mg daily and recheck level on Thursday. Also reviewed with him to make sure he is taking his warfarin in the evenings and not in the morning so that we can adjust his dose on the same day. Once he gets his INR to between 2 and 3 we can stop his Lovenox injections. He says he only has enough to complete Wednesday. If his INR is not at goal we will need to send a new prescription. As far as cancer screening is concerned, he reports that he is due for his colonoscopy this fall.  Last  PSA was a year ago, due this fall. Never had problems with prostate, lung or colon.  He will likely need anticoagulation workup as soon as he is able to come off  of Coumadin. He did call his pharmacy, [facility] to confirm his warfarin dosing.  DM - He is back on his metformin.  He says his blood sugar was 140 this morning. He said they held his metformin on hospital and he was on insulin injections.  Rheumatoid arthritis-he did ask if it was okay to inject his Humira. Since he has no active infection it is okay to do so.

## 2011-11-04 ENCOUNTER — Other Ambulatory Visit: Payer: Self-pay | Admitting: *Deleted

## 2011-11-04 ENCOUNTER — Ambulatory Visit (INDEPENDENT_AMBULATORY_CARE_PROVIDER_SITE_OTHER): Payer: BC Managed Care – PPO | Admitting: Family Medicine

## 2011-11-04 VITALS — BP 128/81 | HR 67

## 2011-11-04 DIAGNOSIS — I2699 Other pulmonary embolism without acute cor pulmonale: Secondary | ICD-10-CM

## 2011-11-04 DIAGNOSIS — I82419 Acute embolism and thrombosis of unspecified femoral vein: Secondary | ICD-10-CM

## 2011-11-04 DIAGNOSIS — I82409 Acute embolism and thrombosis of unspecified deep veins of unspecified lower extremity: Secondary | ICD-10-CM

## 2011-11-04 DIAGNOSIS — I824Y9 Acute embolism and thrombosis of unspecified deep veins of unspecified proximal lower extremity: Secondary | ICD-10-CM

## 2011-11-04 MED ORDER — WARFARIN SODIUM 5 MG PO TABS: 10.0000 mg | ORAL_TABLET | Freq: Every day | ORAL | Status: DC

## 2011-11-04 MED ORDER — ENOXAPARIN SODIUM 100 MG/ML ~~LOC~~ SOLN: 110.0000 mg | Freq: Two times a day (BID) | SUBCUTANEOUS | Status: DC

## 2011-11-04 NOTE — Progress Notes (Signed)
  Subjective:    Patient ID: Francisco Moore, male    DOB: 1945-03-12, 66 y.o.   MRN: [id number] Pt denies any excessive bruising or bleeding. 5 mins spent with pt. Bruising from Lovenox injections.  HPI    Review of Systems     Objective:   Physical Exam        Assessment & Plan:

## 2011-11-05 ENCOUNTER — Ambulatory Visit: Payer: BC Managed Care – PPO | Admitting: Family Medicine

## 2011-11-06 ENCOUNTER — Ambulatory Visit (INDEPENDENT_AMBULATORY_CARE_PROVIDER_SITE_OTHER): Payer: BC Managed Care – PPO | Admitting: Family Medicine

## 2011-11-06 ENCOUNTER — Other Ambulatory Visit: Payer: Self-pay | Admitting: *Deleted

## 2011-11-06 VITALS — BP 136/84 | HR 73

## 2011-11-06 DIAGNOSIS — I824Y9 Acute embolism and thrombosis of unspecified deep veins of unspecified proximal lower extremity: Secondary | ICD-10-CM

## 2011-11-06 DIAGNOSIS — I82409 Acute embolism and thrombosis of unspecified deep veins of unspecified lower extremity: Secondary | ICD-10-CM

## 2011-11-06 DIAGNOSIS — I82419 Acute embolism and thrombosis of unspecified femoral vein: Secondary | ICD-10-CM

## 2011-11-06 DIAGNOSIS — I2699 Other pulmonary embolism without acute cor pulmonale: Secondary | ICD-10-CM

## 2011-11-06 MED ORDER — AMBULATORY NON FORMULARY MEDICATION: Status: DC

## 2011-11-06 NOTE — Progress Notes (Signed)
  Subjective:    Patient ID: Francisco Moore, male    DOB: [birth year], 66 y.o.   MRN: [id number] Pt denies any excessive bruising or bleeding. 5 mins spent with pt.  HPI    Review of Systems     Objective:   Physical Exam        Assessment & Plan:

## 2011-11-09 ENCOUNTER — Encounter: Payer: Self-pay | Admitting: Family Medicine

## 2011-11-09 ENCOUNTER — Ambulatory Visit (INDEPENDENT_AMBULATORY_CARE_PROVIDER_SITE_OTHER): Payer: BC Managed Care – PPO | Admitting: Family Medicine

## 2011-11-09 ENCOUNTER — Ambulatory Visit: Payer: Self-pay | Admitting: Family Medicine

## 2011-11-09 VITALS — BP 117/66 | HR 74 | Wt 242.0 lb

## 2011-11-09 DIAGNOSIS — I2699 Other pulmonary embolism without acute cor pulmonale: Secondary | ICD-10-CM

## 2011-11-09 DIAGNOSIS — I82409 Acute embolism and thrombosis of unspecified deep veins of unspecified lower extremity: Secondary | ICD-10-CM

## 2011-11-09 DIAGNOSIS — I82419 Acute embolism and thrombosis of unspecified femoral vein: Secondary | ICD-10-CM

## 2011-11-09 NOTE — Progress Notes (Signed)
  Subjective:    Patient ID: Francisco Moore, male    DOB: 1945-10-24, 66 y.o.   MRN: [id number]  HPI PE/DVT- He is doing well but bleeding very easily.  He is taking his coumadin regularly.  His breathing is normal.  He is not expensing any cough or shortness of breath. He is getting back to his daily activities. He does complain of some easy bleeding and some large bruises on his abdomen from the Lovenox injections.  He has new hearing aids.  He wanted to make sure that the ear pieces not too far into his ear and affect in his eardrum. Is not having any pain or discomfort.   Review of Systems     Objective:   Physical Exam  Constitutional: He is oriented to person, place, and time. He appears well-developed and well-nourished.  HENT:  Head: Normocephalic and atraumatic.  Cardiovascular: Normal rate, regular rhythm and normal heart sounds.   Pulmonary/Chest: Effort normal and breath sounds normal.  Abdominal:       Large bruises and small hematoma on the abdomen.   Musculoskeletal:       Trace LE on the right LE extremity.  Few small lacerations on the legs.   Neurological: He is alert and oriented to person, place, and time.  Skin: Skin is warm and dry.  Psychiatric: He has a normal mood and affect. His behavior is normal.    TMs appear normal and canals are clear of cerumen bilaterally.      Assessment & Plan:  PE/DVT - He is doing much better. He is supratherapeutic today.  Please see Coumadin flow sheet for adjustments. He can stop the Lovenox. Recheck Coumadin/INR level in one week. He will be heading back to [place]. He is recovering very well  Ear are normal. Gave reassuranc about his hearing aids.

## 2011-11-24 ENCOUNTER — Telehealth: Payer: Self-pay | Admitting: *Deleted

## 2011-11-24 NOTE — Telephone Encounter (Signed)
Wife called and stated the MD in [place] did not receive a referral letter about his care and he has seen the Md x 2 Dr. Glennon Mac -fax [phone]

## 2011-11-24 NOTE — Telephone Encounter (Signed)
We need to fax him records from the hospitalization, notes from May the last couple times he was here as well as the last couple of Coumadin flow sheets.

## 2011-11-25 NOTE — Telephone Encounter (Signed)
Faxed records to Dr. Judi Cong in [facility]

## 2012-05-03 HISTORY — PX: CORONARY STENT PLACEMENT: SHX1402

## 2012-06-02 DIAGNOSIS — C4491 Basal cell carcinoma of skin, unspecified: Secondary | ICD-10-CM

## 2012-06-02 HISTORY — DX: Basal cell carcinoma of skin, unspecified: C44.91

## 2012-08-17 ENCOUNTER — Ambulatory Visit (INDEPENDENT_AMBULATORY_CARE_PROVIDER_SITE_OTHER): Payer: Medicare Other | Admitting: Family Medicine

## 2012-08-17 ENCOUNTER — Encounter: Payer: Self-pay | Admitting: Family Medicine

## 2012-08-17 VITALS — BP 118/64 | HR 69 | Ht 68.6 in | Wt 243.0 lb

## 2012-08-17 DIAGNOSIS — E119 Type 2 diabetes mellitus without complications: Secondary | ICD-10-CM

## 2012-08-17 DIAGNOSIS — Z0000 Encounter for general adult medical examination without abnormal findings: Secondary | ICD-10-CM

## 2012-08-17 DIAGNOSIS — Z1211 Encounter for screening for malignant neoplasm of colon: Secondary | ICD-10-CM

## 2012-08-17 DIAGNOSIS — Z125 Encounter for screening for malignant neoplasm of prostate: Secondary | ICD-10-CM

## 2012-08-17 DIAGNOSIS — I2510 Atherosclerotic heart disease of native coronary artery without angina pectoris: Secondary | ICD-10-CM

## 2012-08-17 MED ORDER — AZELASTINE HCL 0.15 % NA SOLN: 1.0000 | Freq: Two times a day (BID) | NASAL | Status: DC

## 2012-08-17 MED ORDER — AMBULATORY NON FORMULARY MEDICATION: Status: DC

## 2012-08-17 NOTE — Progress Notes (Signed)
Subjective:    Francisco Moore is a 67 y.o. male who presents for a welcome to Medicare exam.   He has had a cough for 3-4 days now. No significant nasal congestion he has had significant postnasal drip. It seems to be worse first thing in the morning. No fever. He has not tried any over-the-counter medications for relief. No ear pain or sore throat. He is scheduled for routine out tomorrow.  Cardiac risk factors: advanced age (older than 3 for men, 38 for women).  Depression Screen (Note: if answer to either of the following is "Yes", a more complete depression screening is indicated)  Q1: Over the past two weeks, have you felt down, depressed or hopeless? no Q2: Over the past two weeks, have you felt little interest or pleasure in doing things? no  Activities of Daily Living In your present state of health, do you have any difficulty performing the following activities?:  Preparing food and eating?: No Bathing yourself: No Getting dressed: No Using the toilet:No Moving around from place to place: No In the past year have you fallen or had a near fall?:No  Current exercise habits: golf  Dietary issues discussed: none  Hearing difficulties: No Safe in current home environment: yes  The following portions of the patient's history were reviewed and updated as appropriate: allergies, current medications, past family history, past medical history, past social history, past surgical history and problem list. Review of Systems A comprehensive review of systems was negative.    Objective:     Vision check Dr. Jacqlyn Larsen at [facility] about 4 months Blood pressure 118/64, pulse 69, height 5' 8.6" (1.742 m), weight 243 lb (110.224 kg). Body mass index is 36.32 kg/(m^2). BP 118/64  Pulse 69  Ht 5' 8.6" (1.742 m)  Wt 243 lb (110.224 kg)  BMI 36.32 kg/m2  General Appearance:    Alert, cooperative, no distress, appears stated age  Head:    Normocephalic, without obvious abnormality,  atraumatic  Eyes:    PERRL, conjunctiva/corneas clear, EOM's intact, both eyes       Ears:    Normal TM's and external ear canals, both ears  Nose:   Nares normal, septum midline, mucosa normal, no drainage    or sinus tenderness  Throat:   Lips, mucosa, and tongue normal; teeth and gums normal  Neck:   Supple, symmetrical, trachea midline, no adenopathy;       thyroid:  No enlargement/tenderness/nodules; no carotid   bruit or JVD  Back:     Symmetric, no curvature, ROM normal, no CVA tenderness  Lungs:     Clear to auscultation bilaterally, respirations unlabored  Chest wall:    No tenderness or deformity  Heart:    Regular rate and rhythm, S1 and S2 normal, no murmur, rub   or gallop  Abdomen:     Soft, non-tender, bowel sounds active all four quadrants,    no masses, no organomegaly  Genitalia:    Normal male without lesion, discharge or tenderness  Rectal:    Normal tone, normal prostate, no masses or tenderness;   guaiac negative stool  Extremities:   Extremities normal, atraumatic, no cyanosis or edema  Pulses:   2+ and symmetric all extremities  Skin:   Skin color, texture, turgor normal, no rashes or lesions  Lymph nodes:   Cervical, supraclavicular, and axillary nodes normal  Neurologic:   CNII-XII intact. Normal strength, sensation and reflexes      throughout  Assessment:    Welcome to Medicare     Plan:     During the course of the visit the patient was educated and counseled about appropriate screening and preventive services including:   Pneumococcal vaccine   Td vaccine  shingles vaccine  Refer for colonoscopy as he is overdue.   Declined pneumon bc of reaction.  Declined Tdap bc he thinks it is UTD  EKG shows rate of 67 beats per minute, normal sinus rhythm with no acute changes.   Cough-likely viral versus allergic rhinitis. Will send her a prescription for nasal antihistamine to help control the postnasal drip which is triggering his cough.  Hopefully this will at least try up some secretions in the helpful while he is having his dental work done. If she's not improving over the next one to 2 weeks or suddenly feels worse, or spikes a fever then please call his back sooner rather than later. DM- Well-controlled. No hypoglycemic events.  Lab Results  Component Value Date   HGBA1C 6.8 08/17/2012     Patient Instructions (the written plan) was given to the patient.

## 2012-08-18 LAB — COMPLETE METABOLIC PANEL WITH GFR
ALT: 25 U/L (ref 0–53)
Albumin: 4.3 g/dL (ref 3.5–5.2)
CO2: 26 mEq/L (ref 19–32)
Calcium: 9.8 mg/dL (ref 8.4–10.5)
Chloride: 97 mEq/L (ref 96–112)
GFR, Est African American: >89 mL/min
GFR, Est Non African American: >89 mL/min
Glucose, Bld: 135 mg/dL — ABNORMAL HIGH (ref 70–99)
Potassium: 4.4 mEq/L (ref 3.5–5.3)
Sodium: 134 mEq/L — ABNORMAL LOW (ref 135–145)
Total Bilirubin: 0.5 mg/dL (ref 0.3–1.2)
Total Protein: 7.1 g/dL (ref 6.0–8.3)

## 2012-08-18 LAB — PSA: PSA: 0.59 ng/mL (ref <=4.00)

## 2012-08-18 LAB — LIPID PANEL
HDL: 51 mg/dL (ref >39)
LDL Cholesterol: 53 mg/dL (ref 0–99)

## 2012-08-19 ENCOUNTER — Telehealth: Payer: Self-pay | Admitting: *Deleted

## 2012-08-19 DIAGNOSIS — Z0000 Encounter for general adult medical examination without abnormal findings: Secondary | ICD-10-CM

## 2012-08-19 NOTE — Telephone Encounter (Signed)
ekg ordered entered.

## 2012-09-07 ENCOUNTER — Other Ambulatory Visit: Payer: Self-pay

## 2012-09-27 ENCOUNTER — Encounter: Payer: Self-pay | Admitting: Sports Medicine

## 2012-09-27 ENCOUNTER — Ambulatory Visit (INDEPENDENT_AMBULATORY_CARE_PROVIDER_SITE_OTHER): Payer: Medicare Other | Admitting: Sports Medicine

## 2012-09-27 VITALS — BP 137/73 | HR 74 | Wt 246.0 lb

## 2012-09-27 DIAGNOSIS — M1711 Unilateral primary osteoarthritis, right knee: Secondary | ICD-10-CM

## 2012-09-27 DIAGNOSIS — M1710 Unilateral primary osteoarthritis, unspecified knee: Secondary | ICD-10-CM

## 2012-09-27 DIAGNOSIS — IMO0002 Reserved for concepts with insufficient information to code with codable children: Secondary | ICD-10-CM

## 2012-09-27 NOTE — Assessment & Plan Note (Signed)
60 cc aspirated from the right knee, followed by injection. Strap with compressive dressing. Return to see me in about 4 weeks before he leaves.

## 2012-09-27 NOTE — Progress Notes (Signed)
  Subjective:    CC: Knee pain  HPI: This is a very pleasant 67 year old male, for the past few weeks she's noted increasing pain as well as swelling in his right knee. He has had an injection and aspiration approximately a year ago, and is wondering if we can do the same. Knee pain is severe, persistent, associated with swelling but no mechanical symptoms. No radiation.  Past medical history, Surgical history, Family history not pertinant except as noted below, Social history, Allergies, and medications have been entered into the medical record, reviewed, and no changes needed.   Review of Systems: No fevers, chills, night sweats, weight loss, chest pain, or shortness of breath.   Objective:    General: Well Developed, well nourished, and in no acute distress.  Neuro: Alert and oriented x3, extra-ocular muscles intact, sensation grossly intact.  HEENT: Normocephalic, atraumatic, pupils equal round reactive to light, neck supple, no masses, no lymphadenopathy, thyroid nonpalpable.  Skin: Warm and dry, no rashes. Cardiac: Regular rate and rhythm, no murmurs rubs or gallops, no lower extremity edema.  Respiratory: Clear to auscultation bilaterally. Not using accessory muscles, speaking in full sentences. Right Knee: There is a visible and palpable moderate effusion. Palpation normal with no warmth, joint line tenderness, patellar tenderness, or condyle tenderness. ROM full in flexion and extension and lower leg rotation. Ligaments with solid consistent endpoints including ACL, PCL, LCL, MCL. Negative Mcmurray's, Apley's, and Thessalonian tests. Non painful patellar compression. Patellar glide without crepitus. Patellar and quadriceps tendons unremarkable. Hamstring and quadriceps strength is normal.   Procedure: Real-time Ultrasound Guided aspiration/injection of right knee Device: GE Logiq E  Verbal informed consent obtained.  Time-out conducted.  Noted no overlying erythema,  induration, or other signs of local infection.  Skin prepped in a sterile fashion.  Local anesthesia: Topical Ethyl chloride.  With sterile technique and under real time ultrasound guidance:  22-gauge needle advanced into knee, 60 cc of straw-colored fluid aspirated, syringe switched and 2 cc Kenalog 40, 4 cc lidocaine injected. Completed without difficulty  Pain immediately resolved suggesting accurate placement of the medication.  Advised to call if fevers/chills, erythema, induration, drainage, or persistent bleeding.  Images permanently stored and available for review in the ultrasound unit.  Impression: Technically successful ultrasound guided injection.  Impression and Recommendations:

## 2012-10-19 ENCOUNTER — Encounter: Payer: Self-pay | Admitting: Sports Medicine

## 2012-10-19 ENCOUNTER — Ambulatory Visit (INDEPENDENT_AMBULATORY_CARE_PROVIDER_SITE_OTHER): Payer: Medicare Other | Admitting: Sports Medicine

## 2012-10-19 VITALS — BP 142/82 | HR 68 | Wt 249.0 lb

## 2012-10-19 DIAGNOSIS — M1711 Unilateral primary osteoarthritis, right knee: Secondary | ICD-10-CM

## 2012-10-19 DIAGNOSIS — IMO0002 Reserved for concepts with insufficient information to code with codable children: Secondary | ICD-10-CM

## 2012-10-19 DIAGNOSIS — Z23 Encounter for immunization: Secondary | ICD-10-CM

## 2012-10-19 DIAGNOSIS — M1710 Unilateral primary osteoarthritis, unspecified knee: Secondary | ICD-10-CM

## 2012-10-19 MED ORDER — AMBULATORY NON FORMULARY MEDICATION: Status: AC

## 2012-10-19 NOTE — Addendum Note (Signed)
Addended by: Pixie Casino on: 10/19/2012 11:43 AM   Modules accepted: Orders

## 2012-10-19 NOTE — Progress Notes (Signed)
  Subjective:    CC: Followup  HPI: Knee osteoarthritis: Status post aspiration of 60 cc, and injection a month ago. He is pain-free. He has some mechanical symptoms consisting predominantly of a pop under his kneecap, and in the posterior medial joint line, he denies any buckling, locking, or pain. Nor swelling.  Past medical history, Surgical history, Family history not pertinant except as noted below, Social history, Allergies, and medications have been entered into the medical record, reviewed, and no changes needed.   Review of Systems: No fevers, chills, night sweats, weight loss, chest pain, or shortness of breath.   Objective:    General: Well Developed, well nourished, and in no acute distress.  Neuro: Alert and oriented x3, extra-ocular muscles intact, sensation grossly intact.  HEENT: Normocephalic, atraumatic, pupils equal round reactive to light, neck supple, no masses, no lymphadenopathy, thyroid nonpalpable.  Skin: Warm and dry, no rashes. Cardiac: Regular rate and rhythm, no murmurs rubs or gallops, no lower extremity edema.  Respiratory: Clear to auscultation bilaterally. Not using accessory muscles, speaking in full sentences. Right Knee: There is minimal visible effusion without a fluid wave. Palpation normal with no warmth, joint line tenderness, patellar tenderness, or condyle tenderness. ROM full in flexion and extension and lower leg rotation. Ligaments with solid consistent endpoints including ACL, PCL, LCL, MCL. Negative Mcmurray's, Apley's, and Thessalonian tests. Non painful patellar compression. Patellar glide without crepitus. Patellar and quadriceps tendons unremarkable. Hamstring and quadriceps strength is normal.  Impression and Recommendations:

## 2012-10-19 NOTE — Assessment & Plan Note (Signed)
Pain is now resolved after aspiration and injection. He still gets some mechanical pop that these are non-painful. They're leaving for [place] and will be there for 8 months. He can return to see me on an as needed basis.

## 2012-10-24 ENCOUNTER — Other Ambulatory Visit: Payer: Self-pay | Admitting: Sports Medicine

## 2013-05-11 ENCOUNTER — Other Ambulatory Visit: Payer: Self-pay

## 2013-08-17 ENCOUNTER — Telehealth: Payer: Self-pay | Admitting: Family Medicine

## 2013-08-17 DIAGNOSIS — Z1211 Encounter for screening for malignant neoplasm of colon: Secondary | ICD-10-CM

## 2013-08-17 NOTE — Telephone Encounter (Signed)
Patient states that he did not get his Colonoscopy last year and wants you to do a referral for him to have one for [facility]

## 2013-08-18 NOTE — Telephone Encounter (Signed)
Ok to place referral.

## 2013-09-20 ENCOUNTER — Encounter: Payer: Self-pay | Admitting: *Deleted

## 2013-09-20 ENCOUNTER — Telehealth: Payer: Self-pay | Admitting: Family Medicine

## 2013-09-20 NOTE — Telephone Encounter (Signed)
Pt's wife called back and stated they are in [facility] and he is being monitored there. As of recently his A1c is 6.5. I asked that she have their office to send his records to Dr. Madilyn Fireman. Maryruth Eve, [place] Crocker

## 2013-09-20 NOTE — Telephone Encounter (Signed)
Please call patient and see who is actually following his diabetes. He hasn't been here in quite some time. We need to schedule him a followup appointment for this.

## 2013-09-20 NOTE — Telephone Encounter (Signed)
Called 2x and phone rang someone picked up and hung up. Maryruth Eve, [place] Crocker

## 2013-09-28 ENCOUNTER — Encounter: Payer: Self-pay | Admitting: Family Medicine

## 2013-11-17 ENCOUNTER — Other Ambulatory Visit: Payer: Self-pay

## 2014-07-30 ENCOUNTER — Other Ambulatory Visit: Payer: Self-pay

## 2014-10-02 ENCOUNTER — Ambulatory Visit (INDEPENDENT_AMBULATORY_CARE_PROVIDER_SITE_OTHER): Payer: Medicare Other | Admitting: Family Medicine

## 2014-10-02 DIAGNOSIS — Z23 Encounter for immunization: Secondary | ICD-10-CM

## 2014-10-02 NOTE — Progress Notes (Signed)
Patient came into clinic today for flu vaccination. Patient was given a flu shot questionnaire prior to administration of immunization. All questions were answered no. Patient tolerated injection of flu immunization in Right deltoid well, with no immediate complications. An information pamphlet regarding flu shot immunization and frequently asked questions was given to Patient prior to leaving clinic. Advised to contact our office with any questions/concerns.

## 2014-10-09 ENCOUNTER — Ambulatory Visit (INDEPENDENT_AMBULATORY_CARE_PROVIDER_SITE_OTHER): Payer: Medicare Other | Admitting: Family Medicine

## 2014-10-09 ENCOUNTER — Encounter: Payer: Self-pay | Admitting: Family Medicine

## 2014-10-09 VITALS — BP 128/79 | HR 98 | Temp 97.9°F | Wt 238.0 lb

## 2014-10-09 DIAGNOSIS — L739 Follicular disorder, unspecified: Secondary | ICD-10-CM | POA: Diagnosis not present

## 2014-10-09 MED ORDER — DOXYCYCLINE HYCLATE 100 MG PO TABS: 100.0000 mg | ORAL_TABLET | Freq: Two times a day (BID) | ORAL | Status: DC

## 2014-10-09 NOTE — Progress Notes (Signed)
Francisco Moore is a 69 y.o. male who presents to [facility]  today for rash. Patient has a rash across the lateral aspect of his face and into his anterior chest present for a few months. It is sometimes mildly painful and not itchy. He's tried some calamine and some hydrocortisone which has helped only a little. No fevers chills nausea vomiting or diarrhea. Feels well otherwise. No new medications of detergents or shampoos.   Past Medical History  Diagnosis Date  . CAD (coronary artery disease)     [place] [facility]- Dr. Derry Lory & Dr. Shelly Rubenstein   . Rheumatoid arthritis(714.0)     Dr. Marveen Reeks   . 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 Skin cancer   . Elevated LFTs   . Diabetes mellitus   . Acid reflux   . Hypertension   . Basal cell carcinoma of skin May 2014    Left eye lid   Past Surgical History  Procedure Laterality Date  . Appendectomy    . Cosmetic surgery      rhinoplasty   . Cardiac catheterization    . Coronary stent placement  April 2014   Social History  Substance Use Topics  . Smoking status: Former Research scientist (life sciences)  . Smokeless tobacco: Never Used     Comment: quit at age 47  . Alcohol Use: Yes     Comment: occasionally   family history includes Crohn's disease in his brother; Heart disease in his brother, father, and mother; Hyperlipidemia in his father; Hypertension in his father; Stroke in his father and mother.  ROS as above Medications: Current Outpatient Prescriptions  Medication Sig Dispense Refill  . Abatacept (ORENCIA IV) Inject 1,000 mg into the vein every 30 (thirty) days.    . AMBULATORY NON FORMULARY MEDICATION Accuchek compact plus test strips. Dx: Diabetes Mellitus 2. Pt testing 2 times a day 100 each 10  . aspirin 325 MG tablet Take 325 mg by mouth daily.    . Cholecalciferol (VITAMIN D) 2000 UNITS tablet Take 2,000 Units by mouth daily.    . clopidogrel (PLAVIX) 75 MG tablet Take 75 mg by mouth daily.    Marland Kitchen  glimepiride (AMARYL) 4 MG tablet Take 4 mg by mouth daily before breakfast.    . metFORMIN (GLUCOPHAGE) 500 MG tablet Take 500 mg by mouth 2 (two) times daily with a meal.    . methylPREDNISolone (MEDROL) 4 MG tablet Take 4 mg by mouth daily.    . mirtazapine (REMERON) 30 MG tablet Take 30 mg by mouth at bedtime as needed. As needed for insomnia, rarely used    . Multiple Vitamin (MULTIVITAMIN WITH MINERALS) TABS Take 1 tablet by mouth every other day.    . nitroGLYCERIN (NITROSTAT) 0.4 MG SL tablet Place 0.4 mg under the tongue every 5 (five) minutes as needed.    . pantoprazole (PROTONIX) 40 MG tablet Take 40 mg by mouth daily.    . ramipril (ALTACE) 10 MG tablet Take 10 mg by mouth daily.    . rosuvastatin (CRESTOR) 20 MG tablet Take 20 mg by mouth daily.    Marland Kitchen doxycycline (VIBRA-TABS) 100 MG tablet Take 1 tablet (100 mg total) by mouth 2 (two) times daily. 20 tablet 0   No current facility-administered medications for this visit.   Allergies  Allergen Reactions  . Atorvastatin Other (See Comments)    REACTION: elevated transaminases  . Flurosyn [Fluocinolone] Other (See Comments)    Made eyes  burn.     Exam:  BP 128/79 mmHg  Pulse 98  Temp(Src) 97.9 F (36.6 C)  Wt 238 lb (107.956 kg) Gen: Well NAD HEENT: EOMI,  MMM Lungs: Normal work of breathing. CTABL Heart: RRR no MRG Abd: NABS, Soft. Nondistended, Nontender Exts: Brisk capillary refill, warm and well perfused.  Skin: Maculopapular erythematous rash blanchable on the lateral aspect of his face bilaterally and into the anterior aspect of his chest. Some papules present.  No results found for this or any previous visit (from the past 24 hour(s)). No results found.   Please see individual assessment and plan sections.

## 2014-10-09 NOTE — Patient Instructions (Signed)
Thank you for coming in today. Take doxycycline twice daily for 10 days. Use sunscreen to prevent sunburn while taking this medicine Follow-up with your doctor if you don't get better  Folliculitis  Folliculitis is redness, soreness, and swelling (inflammation) of the hair follicles. This condition can occur anywhere on the body. People with weakened immune systems, diabetes, or obesity have a greater risk of getting folliculitis. CAUSES  Bacterial infection. This is the most common cause.  Fungal infection.  Viral infection.  Contact with certain chemicals, especially oils and tars. Long-term folliculitis can result from bacteria that live in the nostrils. The bacteria may trigger multiple outbreaks of folliculitis over time. SYMPTOMS Folliculitis most commonly occurs on the scalp, thighs, legs, back, buttocks, and areas where hair is shaved frequently. An early sign of folliculitis is a small, white or yellow, pus-filled, itchy lesion (pustule). These lesions appear on a red, inflamed follicle. They are usually less than 0.2 inches (5 mm) wide. When there is an infection of the follicle that goes deeper, it becomes a boil or furuncle. A group of closely packed boils creates a larger lesion (carbuncle). Carbuncles tend to occur in hairy, sweaty areas of the body. DIAGNOSIS  Your caregiver can usually tell what is wrong by doing a physical exam. A sample may be taken from one of the lesions and tested in a lab. This can help determine what is causing your folliculitis. TREATMENT  Treatment may include:  Applying warm compresses to the affected areas.  Taking antibiotic medicines orally or applying them to the skin.  Draining the lesions if they contain a large amount of pus or fluid.  Laser hair removal for cases of long-lasting folliculitis. This helps to prevent regrowth of the hair. HOME CARE INSTRUCTIONS  Apply warm compresses to the affected areas as directed by your  caregiver.  If antibiotics are prescribed, take them as directed. Finish them even if you start to feel better.  You may take over-the-counter medicines to relieve itching.  Do not shave irritated skin.  Follow up with your caregiver as directed. SEEK IMMEDIATE MEDICAL CARE IF:   You have increasing redness, swelling, or pain in the affected area.  You have a fever. MAKE SURE YOU:  Understand these instructions.  Will watch your condition.  Will get help right away if you are not doing well or get worse. Document Released: 03/30/2001 Document Revised: 07/21/2011 Document Reviewed: 04/21/2011 [facility] Patient Information 2015 [place], [place]. This information is not intended to replace advice given to you by your health care provider. Make sure you discuss any questions you have with your health care provider.

## 2014-10-09 NOTE — Assessment & Plan Note (Signed)
I suspect the predominant cause of his rash is folliculitis. Trial of doxycycline. Warned patient about risk of sunburn. Follow-up if not better.

## 2014-10-12 ENCOUNTER — Ambulatory Visit (INDEPENDENT_AMBULATORY_CARE_PROVIDER_SITE_OTHER): Payer: Medicare Other

## 2014-10-12 ENCOUNTER — Ambulatory Visit (INDEPENDENT_AMBULATORY_CARE_PROVIDER_SITE_OTHER): Payer: Medicare Other | Admitting: Family Medicine

## 2014-10-12 ENCOUNTER — Encounter: Payer: Self-pay | Admitting: Family Medicine

## 2014-10-12 VITALS — BP 134/81 | HR 104 | Temp 98.1°F | Wt 234.0 lb

## 2014-10-12 DIAGNOSIS — R52 Pain, unspecified: Secondary | ICD-10-CM | POA: Diagnosis not present

## 2014-10-12 DIAGNOSIS — R079 Chest pain, unspecified: Secondary | ICD-10-CM | POA: Diagnosis not present

## 2014-10-12 DIAGNOSIS — R69 Illness, unspecified: Secondary | ICD-10-CM

## 2014-10-12 DIAGNOSIS — R000 Tachycardia, unspecified: Secondary | ICD-10-CM | POA: Diagnosis not present

## 2014-10-12 DIAGNOSIS — J111 Influenza due to unidentified influenza virus with other respiratory manifestations: Secondary | ICD-10-CM | POA: Diagnosis not present

## 2014-10-12 NOTE — Patient Instructions (Signed)
Thank you for coming in today. STOP doxycycline. Use tylenol for pain or fever.  Call or go to the emergency room if you get worse, have trouble breathing, have chest pains, or palpitations.   Labs and Xray today.   Shortness of Breath Shortness of breath means you have trouble breathing. It could also mean that you have a medical problem. You should get immediate medical care for shortness of breath. CAUSES   Not enough oxygen in the air such as with high altitudes or a smoke-filled room.  Certain lung diseases, infections, or problems.  Heart disease or conditions, such as angina or heart failure.  Low red blood cells (anemia).  Poor physical fitness, which can cause shortness of breath when you exercise.  Chest or back injuries or stiffness.  Being overweight.  Smoking.  Anxiety, which can make you feel like you are not getting enough air. DIAGNOSIS  Serious medical problems can often be found during your physical exam. Tests may also be done to determine why you are having shortness of breath. Tests may include:  Chest X-rays.  Lung function tests.  Blood tests.  An electrocardiogram (ECG).  An ambulatory electrocardiogram. An ambulatory ECG records your heartbeat patterns over a 24-hour period.  Exercise testing.  A transthoracic echocardiogram (TTE). During echocardiography, sound waves are used to evaluate how blood flows through your heart.  A transesophageal echocardiogram (TEE).  Imaging scans. Your health care provider may not be able to find a cause for your shortness of breath after your exam. In this case, it is important to have a follow-up exam with your health care provider as directed.  TREATMENT  Treatment for shortness of breath depends on the cause of your symptoms and can vary greatly. HOME CARE INSTRUCTIONS   Do not smoke. Smoking is a common cause of shortness of breath. If you smoke, ask for help to quit.  Avoid being around chemicals or  things that may bother your breathing, such as paint fumes and dust.  Rest as needed. Slowly resume your usual activities.  If medicines were prescribed, take them as directed for the full length of time directed. This includes oxygen and any inhaled medicines.  Keep all follow-up appointments as directed by your health care provider. SEEK MEDICAL CARE IF:   Your condition does not improve in the time expected.  You have a hard time doing your normal activities even with rest.  You have any new symptoms. SEEK IMMEDIATE MEDICAL CARE IF:   Your shortness of breath gets worse.  You feel light-headed, faint, or develop a cough not controlled with medicines.  You start coughing up blood.  You have pain with breathing.  You have chest pain or pain in your arms, shoulders, or abdomen.  You have a fever.  You are unable to walk up stairs or exercise the way you normally do. MAKE SURE YOU:  Understand these instructions.  Will watch your condition.  Will get help right away if you are not doing well or get worse. Document Released: 10/14/2000 Document Revised: 01/24/2013 Document Reviewed: 04/06/2011 [facility] Patient Information 2015 [place], [place]. This information is not intended to replace advice given to you by your health care provider. Make sure you discuss any questions you have with your health care provider.

## 2014-10-12 NOTE — Progress Notes (Signed)
Francisco Moore is a 69 y.o. male who presents to [facility]: [facility]  today for shortness of breath fatigue and weakness and vomiting. Symptoms present for a few days. Patient was seen this week for a rash thought to be folliculitis. He was treated with doxycycline. The symptoms started about a day after he started taking doxycycline. He notes minimal nonradiating nonexertional chest pain. He denies any wheezing or orthopnea or leg swelling. He denies any diarrhea. He feels somewhat dizzy and lightheaded especially when he walks. He notes the shortness of breath seems to be worse with exertion.  He notes that he is feeling much better today he did yesterday.   Past Medical History  Diagnosis Date  . CAD (coronary artery disease)     [place] [facility]- Dr. Derry Lory & Dr. Shelly Rubenstein   . Rheumatoid arthritis(714.0)     Dr. Marveen Reeks   . 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 Skin cancer   . Elevated LFTs   . Diabetes mellitus   . Acid reflux   . Hypertension   . Basal cell carcinoma of skin May 2014    Left eye lid   Past Surgical History  Procedure Laterality Date  . Appendectomy    . Cosmetic surgery      rhinoplasty   . Cardiac catheterization    . Coronary stent placement  April 2014   Social History  Substance Use Topics  . Smoking status: Former Research scientist (life sciences)  . Smokeless tobacco: Never Used     Comment: quit at age 60  . Alcohol Use: Yes     Comment: occasionally   family history includes Crohn's disease in his brother; Heart disease in his brother, father, and mother; Hyperlipidemia in his father; Hypertension in his father; Stroke in his father and mother.  ROS as above Medications: Current Outpatient Prescriptions  Medication Sig Dispense Refill  . Abatacept (ORENCIA IV) Inject 1,000 mg into the vein every 30 (thirty) days.    . AMBULATORY NON FORMULARY MEDICATION Accuchek compact plus test strips. Dx: Diabetes Mellitus 2. Pt testing 2 times a day  100 each 10  . aspirin 325 MG tablet Take 325 mg by mouth daily.    . Cholecalciferol (VITAMIN D) 2000 UNITS tablet Take 2,000 Units by mouth daily.    . clopidogrel (PLAVIX) 75 MG tablet Take 75 mg by mouth daily.    Marland Kitchen glimepiride (AMARYL) 4 MG tablet Take 4 mg by mouth daily before breakfast.    . metFORMIN (GLUCOPHAGE) 500 MG tablet Take 500 mg by mouth 2 (two) times daily with a meal.    . methylPREDNISolone (MEDROL) 4 MG tablet Take 4 mg by mouth daily.    . mirtazapine (REMERON) 30 MG tablet Take 30 mg by mouth at bedtime as needed. As needed for insomnia, rarely used    . Multiple Vitamin (MULTIVITAMIN WITH MINERALS) TABS Take 1 tablet by mouth every other day.    . nitroGLYCERIN (NITROSTAT) 0.4 MG SL tablet Place 0.4 mg under the tongue every 5 (five) minutes as needed.    . pantoprazole (PROTONIX) 40 MG tablet Take 40 mg by mouth daily.    . ramipril (ALTACE) 10 MG tablet Take 10 mg by mouth daily.    . rosuvastatin (CRESTOR) 20 MG tablet Take 20 mg by mouth daily.     No current facility-administered medications for this visit.   Allergies  Allergen Reactions  . Doxycycline Nausea And Vomiting  Flu Like illness  . Atorvastatin Other (See Comments)    REACTION: elevated transaminases  . Flurosyn [Fluocinolone] Other (See Comments)    Made eyes burn.     Exam:  BP 134/81 mmHg  Pulse 104  Temp(Src) 98.1 F (36.7 C) (Oral)  Wt 234 lb (106.142 kg) Orthostatic VS for the past 24 hrs:  BP- Lying Pulse- Lying BP- Sitting Pulse- Sitting BP- Standing at 0 minutes Pulse- Standing at 0 minutes  10/12/14 1202 110/65 mmHg 87 109/65 mmHg 104 111/73 mmHg 106      Gen: Well NAD HEENT: EOMI,  MMM Lungs: Normal work of breathing. CTABL Heart: RRR no MRG heart rate normal per my check Abd: NABS, Soft. Nondistended, Nontender Exts: Brisk capillary refill, warm and well perfused.   Twelve-lead EKG shows normal sinus rhythm at 80 bpm. No ST segment elevations or depressions. No Q  waves. Normal axis and intervals. EKG is not significantly changed from July 2014.  No results found for this or any previous visit (from the past 24 hour(s)). No results found.   Please see individual assessment and plan sections.

## 2014-10-12 NOTE — Assessment & Plan Note (Signed)
Symptoms most consistent with influenza-like illness. Doubtful for coronary artery disease or venous thromboembolism.  His vital signs at rest or normal. Plan to obtain chest x-ray CMP and CBC. Patient is improving spontaneously. He is Tylenol conservative treatment. Recommend discontinue doxycycline although I'm doubtful this is a cause of his symptoms. I've added this medicine to the allergy list.

## 2014-10-13 LAB — COMPLETE METABOLIC PANEL WITH GFR
ALT: 29 U/L (ref 9–46)
AST: 48 U/L — AB (ref 10–35)
Albumin: 4.2 g/dL (ref 3.6–5.1)
Alkaline Phosphatase: 46 U/L (ref 40–115)
BILIRUBIN TOTAL: 0.6 mg/dL (ref 0.2–1.2)
BUN: 10 mg/dL (ref 7–25)
CHLORIDE: 95 mmol/L — AB (ref 98–110)
CO2: 22 mmol/L (ref 20–31)
CREATININE: 0.83 mg/dL (ref 0.70–1.25)
Calcium: 9.8 mg/dL (ref 8.6–10.3)
GFR, Est Non African American: >89 mL/min (ref >=60)
GLUCOSE: 236 mg/dL — AB (ref 65–99)
Potassium: 4.7 mmol/L (ref 3.5–5.3)
Sodium: 133 mmol/L — ABNORMAL LOW (ref 135–146)
TOTAL PROTEIN: 7.1 g/dL (ref 6.1–8.1)

## 2014-10-13 LAB — CBC
HCT: 44 % (ref 39.0–52.0)
Hemoglobin: 15 g/dL (ref 13.0–17.0)
MCH: 28.5 pg (ref 26.0–34.0)
MCHC: 34.1 g/dL (ref 30.0–36.0)
MCV: 83.5 fL (ref 78.0–100.0)
MPV: 9.7 fL (ref 8.6–12.4)
PLATELETS: 192 10*3/uL (ref 150–400)
RBC: 5.27 MIL/uL (ref 4.22–5.81)
RDW: 14.9 % (ref 11.5–15.5)
WBC: 7.7 10*3/uL (ref 4.0–10.5)

## 2014-10-13 NOTE — Progress Notes (Signed)
Quick Note:  Labs look pretty stable.  Plan for follow along for improvement. ______

## 2014-10-16 ENCOUNTER — Telehealth: Payer: Self-pay

## 2014-10-16 NOTE — Telephone Encounter (Signed)
Pt called requesting the results of the chest xray he had done on 10/12/2014. It does not look like you have resulted this. Advised pt that I would call him in the morning with the results. Please advise.

## 2014-10-17 NOTE — Telephone Encounter (Signed)
Pt notified of results

## 2014-10-17 NOTE — Telephone Encounter (Signed)
Xray shows bronchitis.

## 2016-04-08 IMAGING — CR DG CHEST 2V
2 series · 2 of 2 positions shown · non-contrast
Comparison: Chest radiograph dated 10/28/2011 clear

CLINICAL DATA: Chest pain and shallow breathing. Flu like symptoms.

EXAM:
CHEST  2 VIEW

[chest pa]
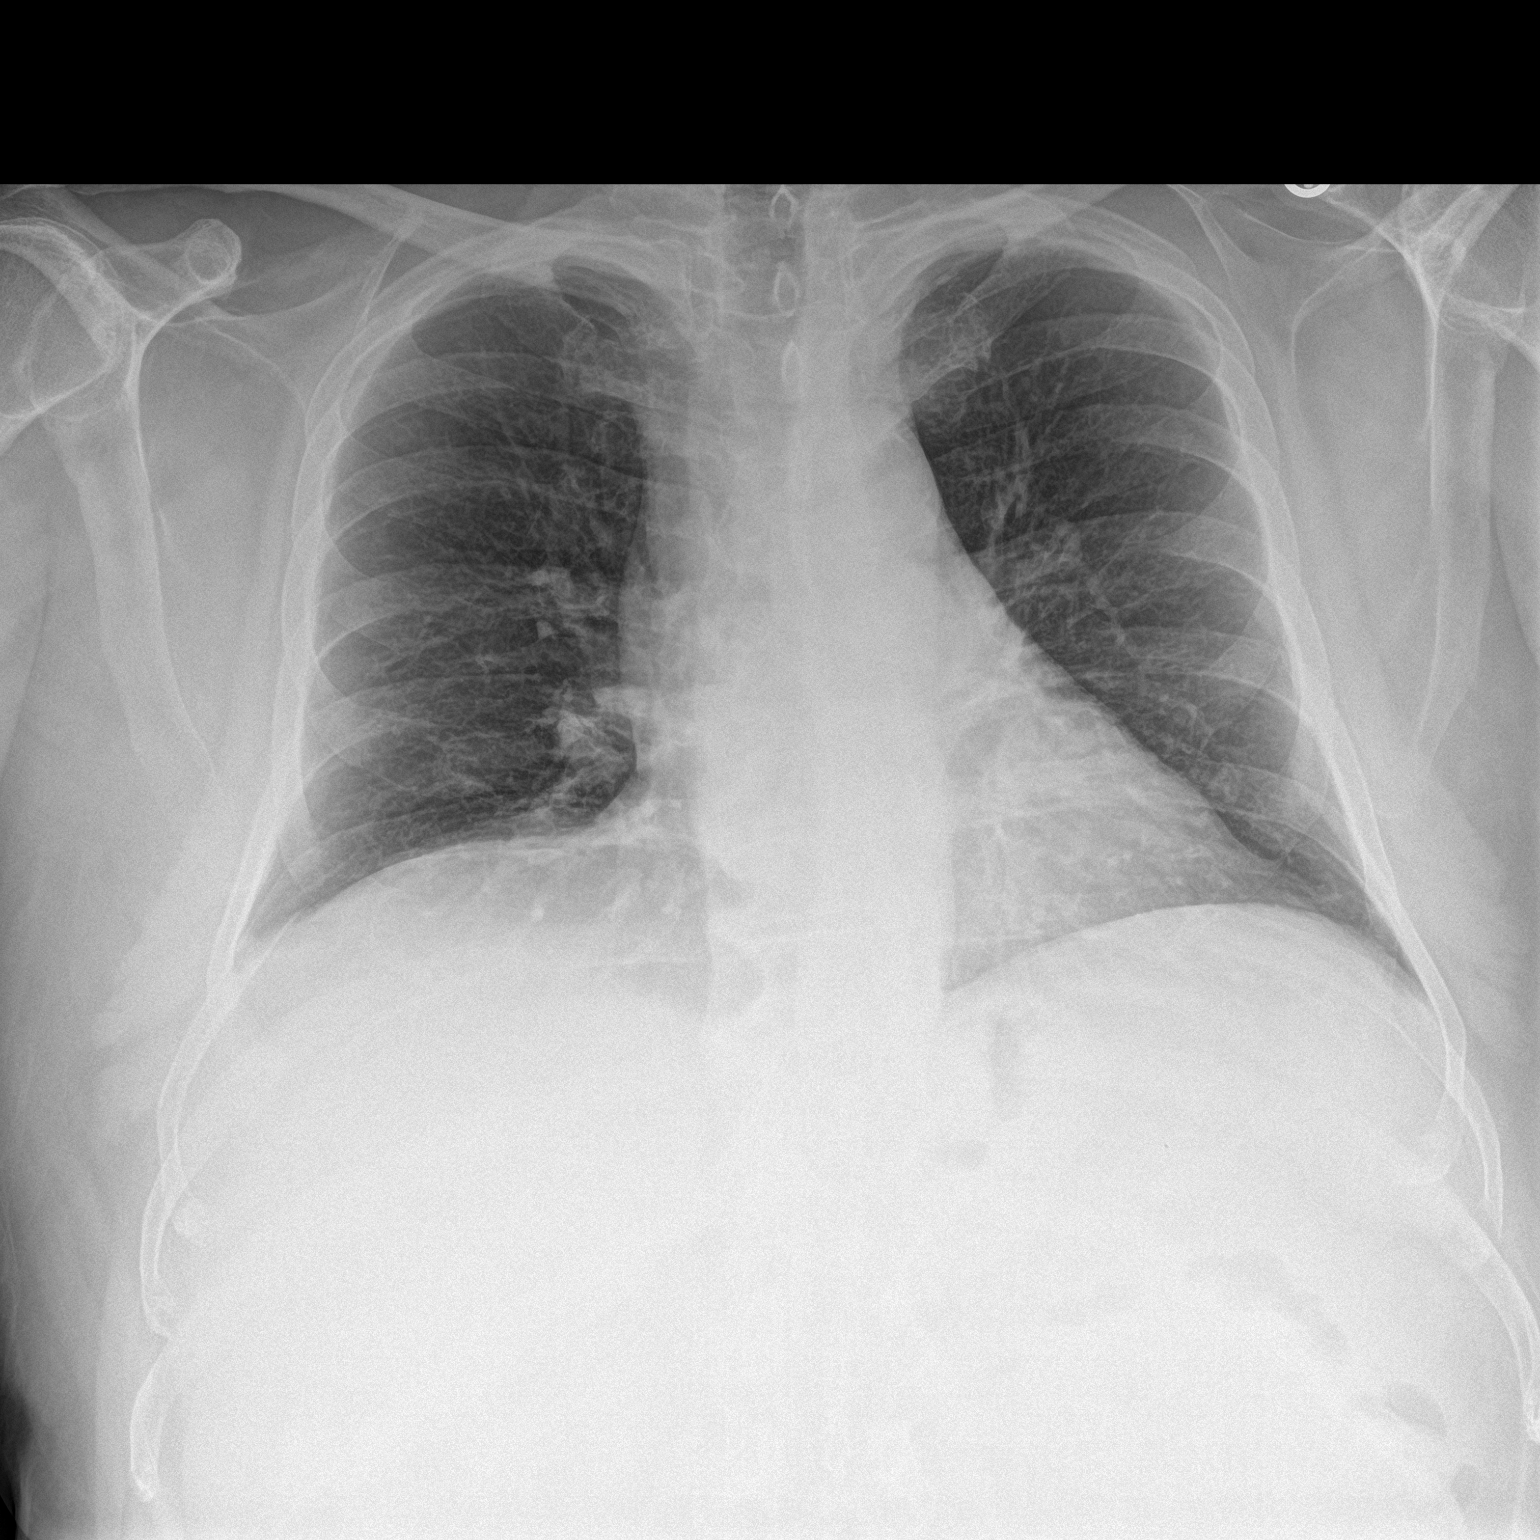

[chest lat]
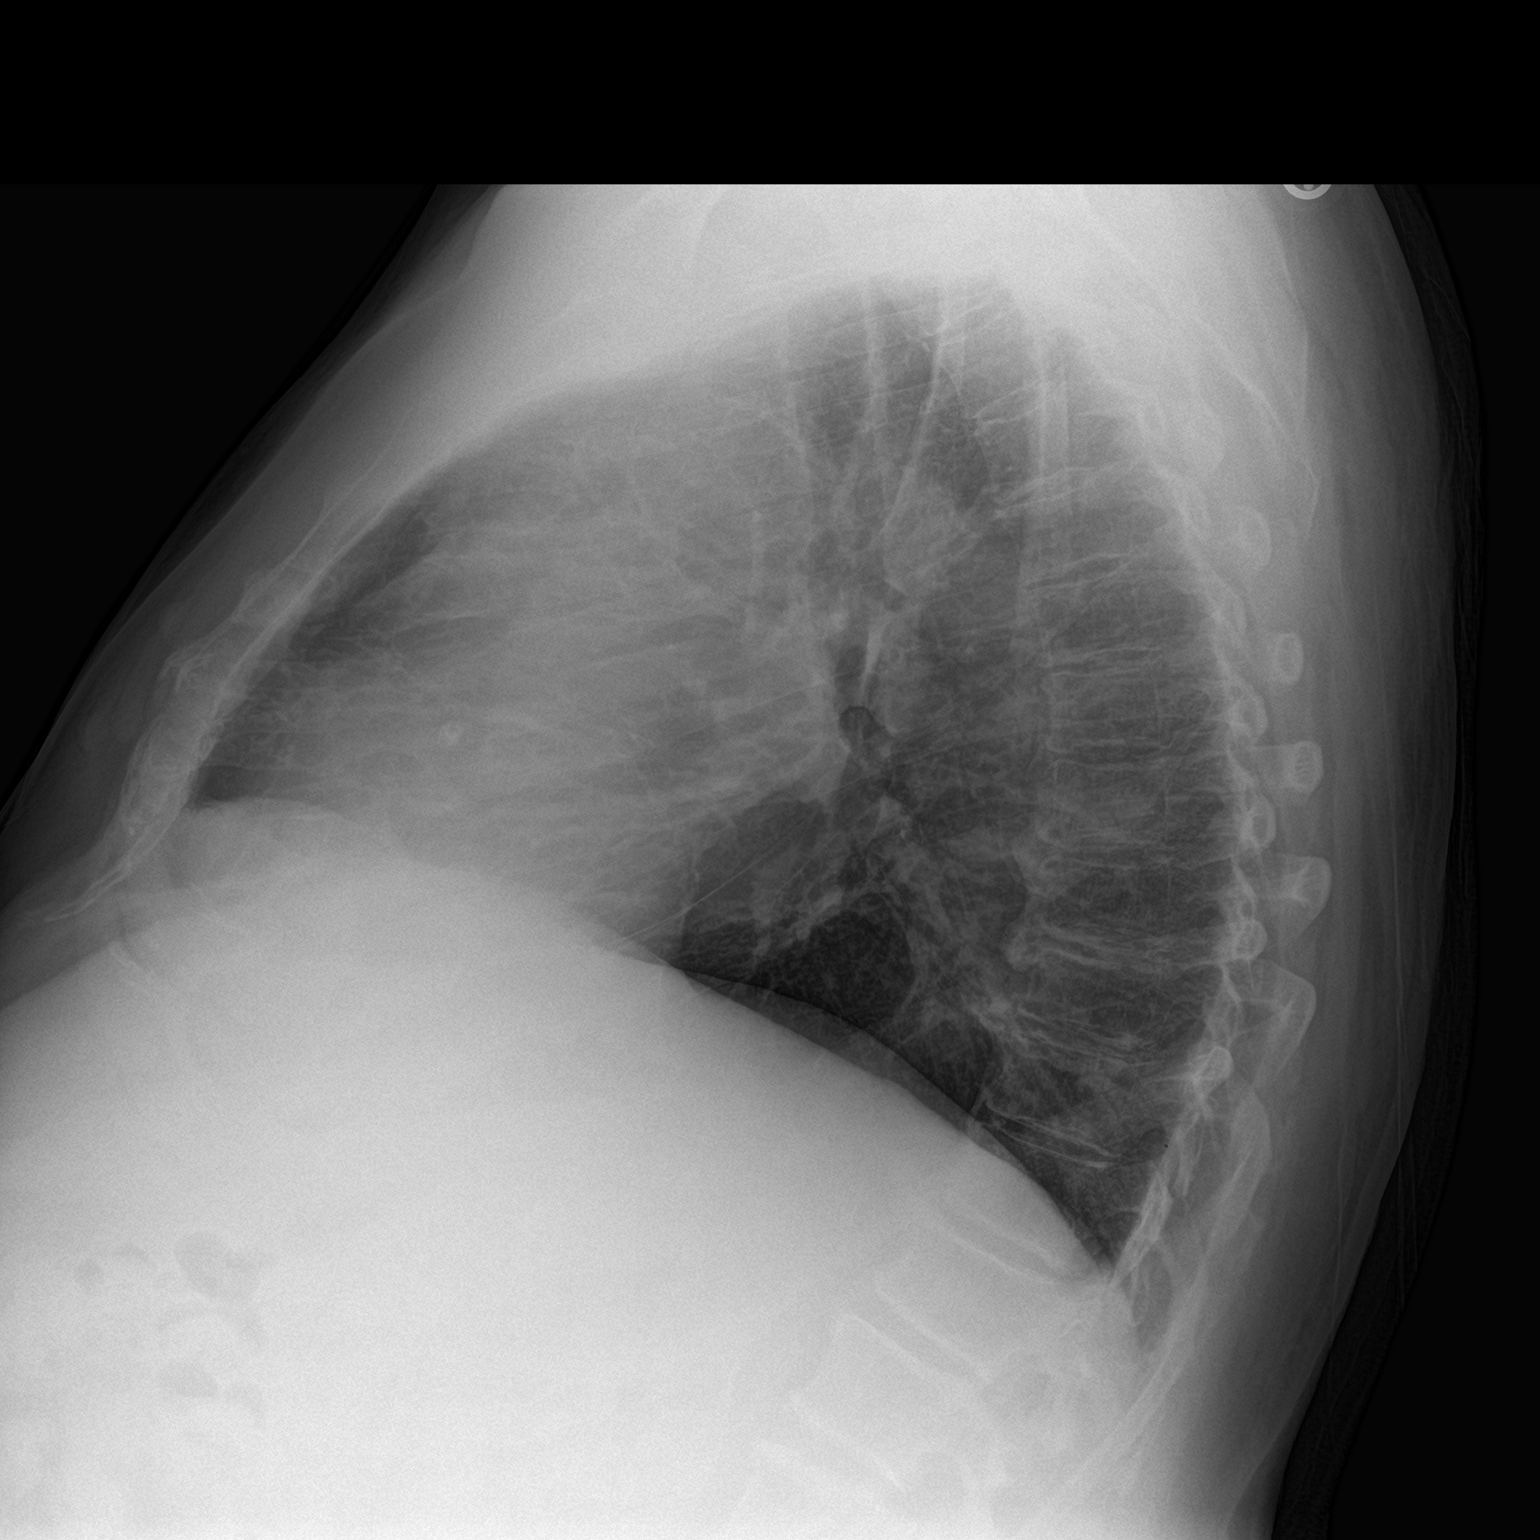

[2 of 2 positions shown; findings below may reference images not displayed]

FINDINGS: Cardiomediastinal silhouette is normal. Mediastinal contours appear
intact.

There is no evidence of focal airspace consolidation, pleural
effusion or pneumothorax. There is prominence of interstitial
markings with central predominance, which may be seen with
bronchitis or reactive airway disease. Lung volumes are low.

Osseous structures are without acute abnormality. Thoracic spine
diffuse idiopathic skeletal hyperostosis is seen. Soft tissues are
grossly normal.
IMPRESSION: Prominence of the interstitial markings with central predominance,
which may be seen with reactive airway disease on bronchitis.

Low lung volumes.

## 2019-06-03 DEATH — deceased
